# Patient Record
Sex: Female | Born: 1955
Health system: Southern US, Community
[De-identification: ages and names within clinical notes are randomized; demographics above are authoritative.]

## PROBLEM LIST (undated history)

## (undated) DIAGNOSIS — K219 Gastro-esophageal reflux disease without esophagitis: Secondary | ICD-10-CM

## (undated) DIAGNOSIS — E119 Type 2 diabetes mellitus without complications: Secondary | ICD-10-CM

## (undated) DIAGNOSIS — F419 Anxiety disorder, unspecified: Secondary | ICD-10-CM

## (undated) DIAGNOSIS — I10 Essential (primary) hypertension: Secondary | ICD-10-CM

## (undated) DIAGNOSIS — G4730 Sleep apnea, unspecified: Secondary | ICD-10-CM

## (undated) DIAGNOSIS — C569 Malignant neoplasm of unspecified ovary: Secondary | ICD-10-CM

## (undated) DIAGNOSIS — M1990 Unspecified osteoarthritis, unspecified site: Secondary | ICD-10-CM

## (undated) DIAGNOSIS — Z8489 Family history of other specified conditions: Secondary | ICD-10-CM

## (undated) HISTORY — DX: Anxiety disorder, unspecified: F41.9

## (undated) HISTORY — DX: Type 2 diabetes mellitus without complications: E11.9

## (undated) HISTORY — DX: Essential (primary) hypertension: I10

## (undated) HISTORY — PX: NOSE SURGERY: SHX723

## (undated) HISTORY — DX: Malignant neoplasm of unspecified ovary: C56.9

---

## 2004-06-14 DIAGNOSIS — C569 Malignant neoplasm of unspecified ovary: Secondary | ICD-10-CM

## 2004-06-14 HISTORY — PX: ABDOMINAL HYSTERECTOMY: SHX81

## 2004-06-14 HISTORY — DX: Malignant neoplasm of unspecified ovary: C56.9

## 2005-12-24 ENCOUNTER — Ambulatory Visit: Payer: Self-pay | Admitting: Family Medicine

## 2006-01-17 ENCOUNTER — Inpatient Hospital Stay: Payer: Self-pay | Admitting: Obstetrics and Gynecology

## 2006-01-28 ENCOUNTER — Ambulatory Visit: Payer: Self-pay | Admitting: Obstetrics and Gynecology

## 2006-02-23 ENCOUNTER — Ambulatory Visit: Payer: Self-pay | Admitting: Gynecologic Oncology

## 2006-03-14 ENCOUNTER — Ambulatory Visit: Payer: Self-pay | Admitting: Gynecologic Oncology

## 2006-04-14 ENCOUNTER — Ambulatory Visit: Payer: Self-pay | Admitting: Gynecologic Oncology

## 2006-05-14 ENCOUNTER — Ambulatory Visit: Payer: Self-pay | Admitting: Gynecologic Oncology

## 2006-05-31 ENCOUNTER — Ambulatory Visit: Payer: Self-pay | Admitting: Family Medicine

## 2006-06-14 ENCOUNTER — Ambulatory Visit: Payer: Self-pay | Admitting: Gynecologic Oncology

## 2006-07-15 ENCOUNTER — Ambulatory Visit: Payer: Self-pay | Admitting: Gynecologic Oncology

## 2007-02-03 ENCOUNTER — Ambulatory Visit: Payer: Self-pay | Admitting: Internal Medicine

## 2007-02-13 ENCOUNTER — Ambulatory Visit: Payer: Self-pay | Admitting: Internal Medicine

## 2007-06-15 ENCOUNTER — Ambulatory Visit: Payer: Self-pay | Admitting: Gynecologic Oncology

## 2007-06-20 ENCOUNTER — Ambulatory Visit: Payer: Self-pay | Admitting: Gastroenterology

## 2007-07-31 ENCOUNTER — Ambulatory Visit: Payer: Self-pay | Admitting: Family Medicine

## 2007-10-13 ENCOUNTER — Ambulatory Visit: Payer: Self-pay | Admitting: Gynecologic Oncology

## 2007-11-13 ENCOUNTER — Ambulatory Visit: Payer: Self-pay | Admitting: Gynecologic Oncology

## 2008-03-14 ENCOUNTER — Ambulatory Visit: Payer: Self-pay | Admitting: Gynecologic Oncology

## 2008-03-20 IMAGING — CT CT CHEST-ABD-PELV W/ CM
1 of 3 series · 13 of 31 positions shown, 19 images · non-contrast
Comparison: none

REASON FOR EXAM: Abdominal mass
COMMENTS:

[Series 2: soft tissue · axial · 0.74mm/px · z∈[-624,-98]mm · 13 of 125 slices shown, 19 images]
[im 10/125  mediastinal]
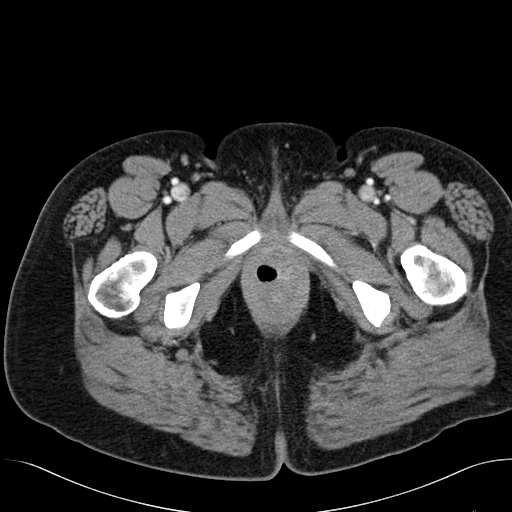
[im 10/125  bone]
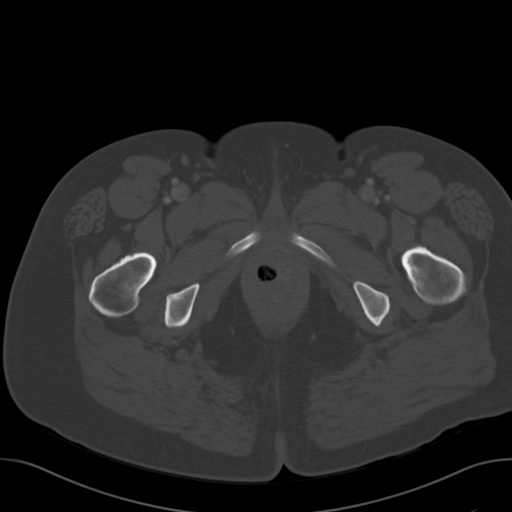
[im 20/125  mediastinal]
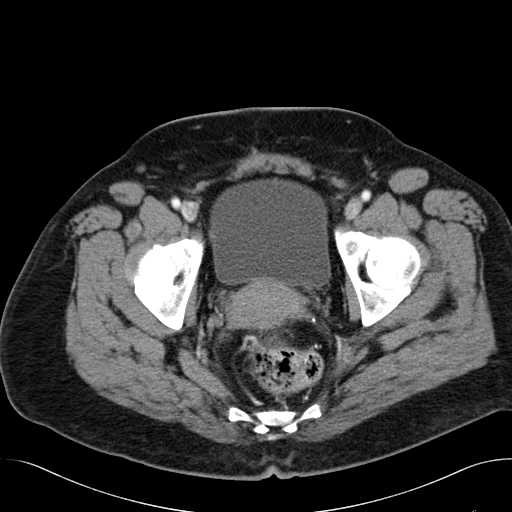
[im 29/125  mediastinal]
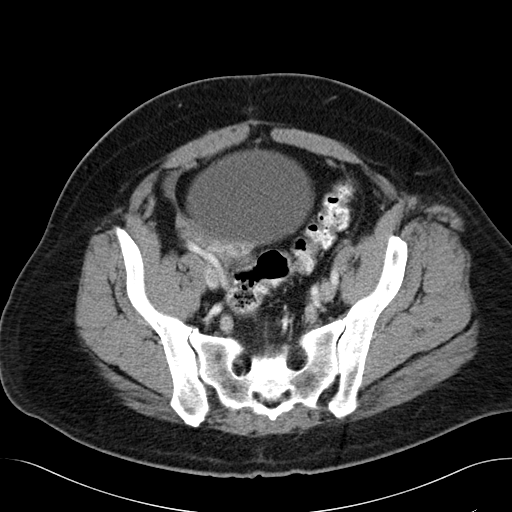
[im 39/125  mediastinal]
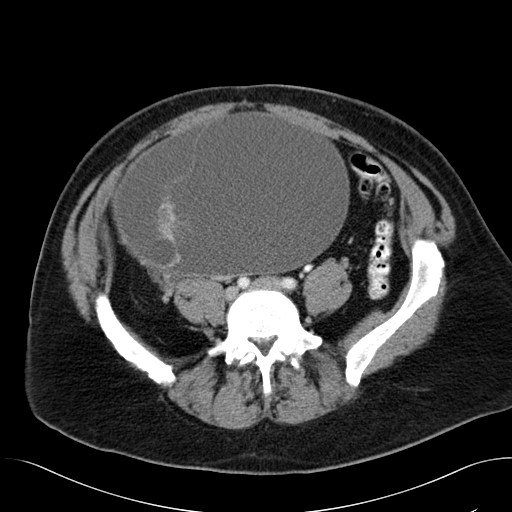
[im 48/125  mediastinal]
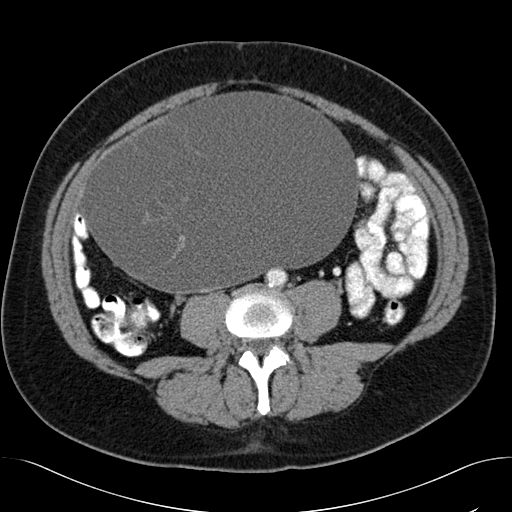
[im 58/125  mediastinal]
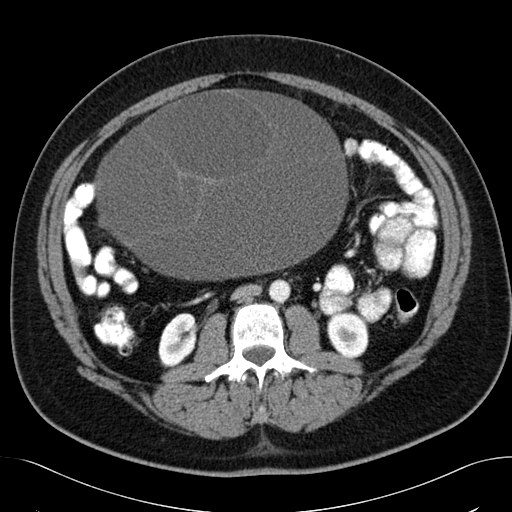
[im 63/125  mediastinal]
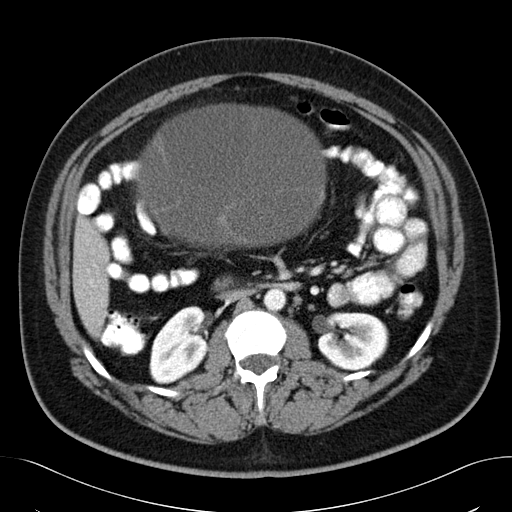
[im 67/125  mediastinal]
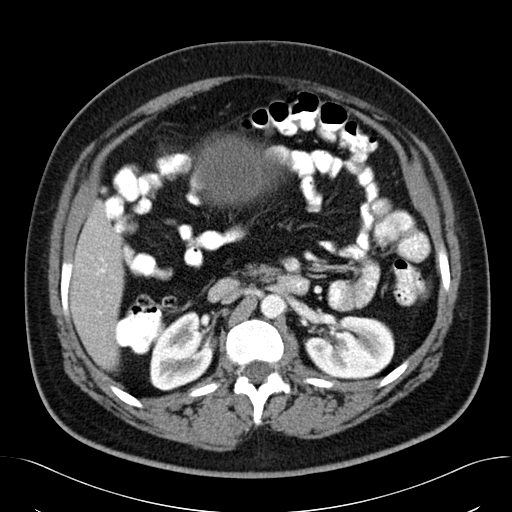
[im 77/125  mediastinal]
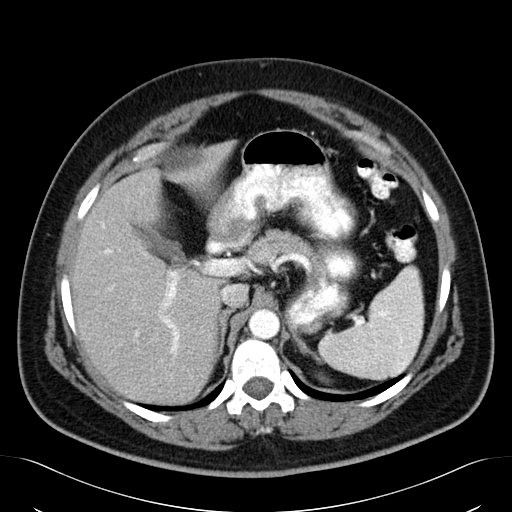
[im 77/125  bone]
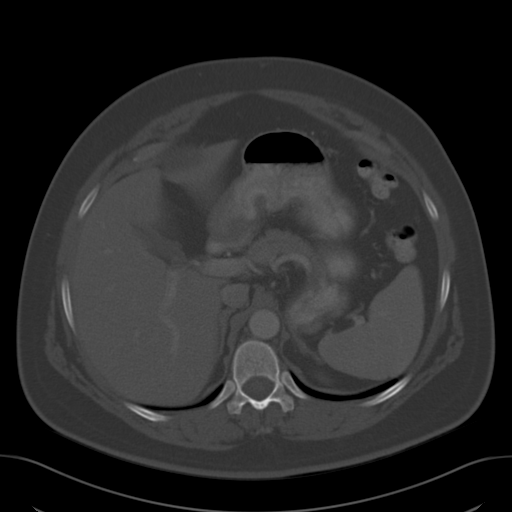
[im 86/125  mediastinal]
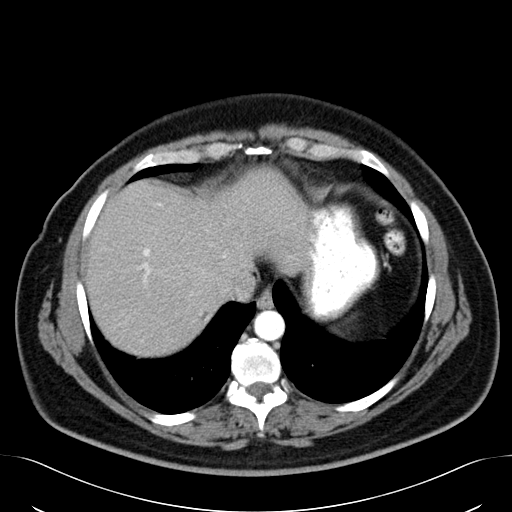
[im 86/125  lung]
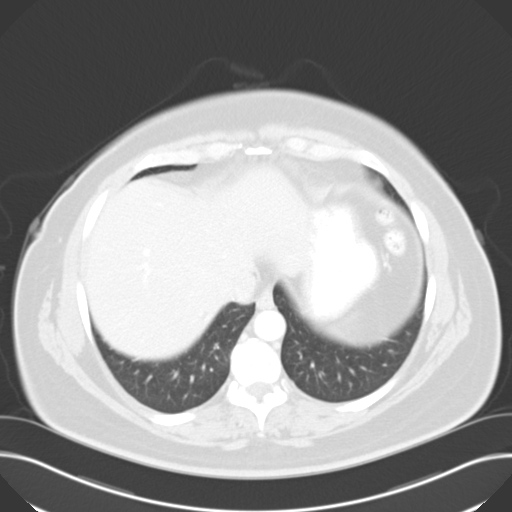
[im 96/125  mediastinal]
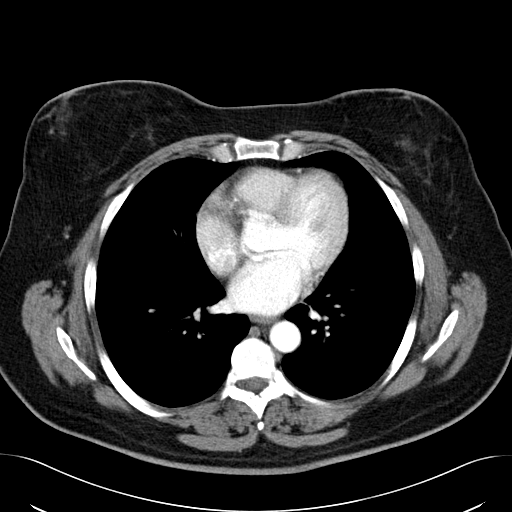
[im 96/125  lung]
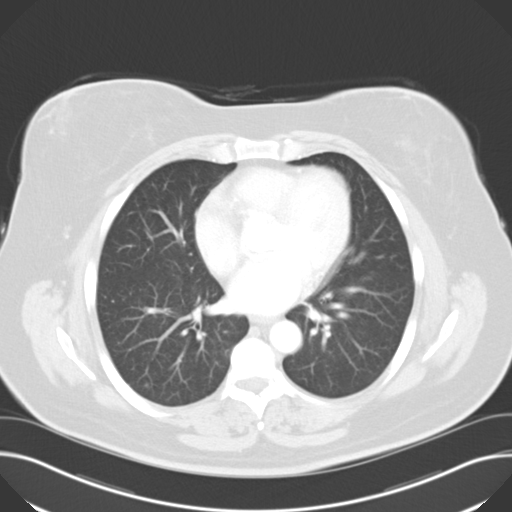
[im 105/125  mediastinal]
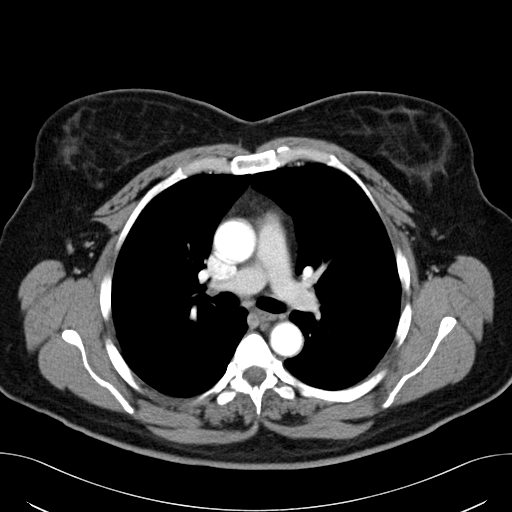
[im 105/125  lung]
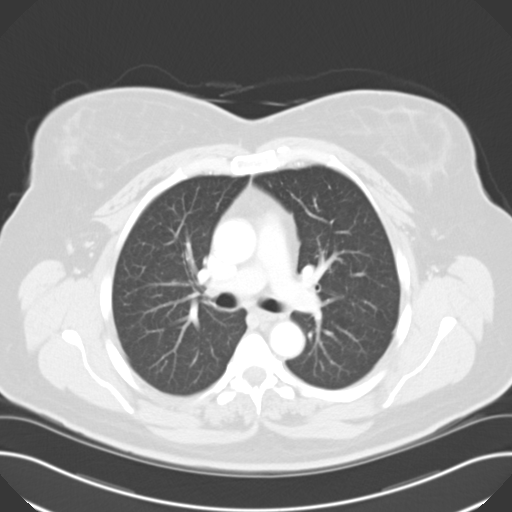
[im 115/125  mediastinal]
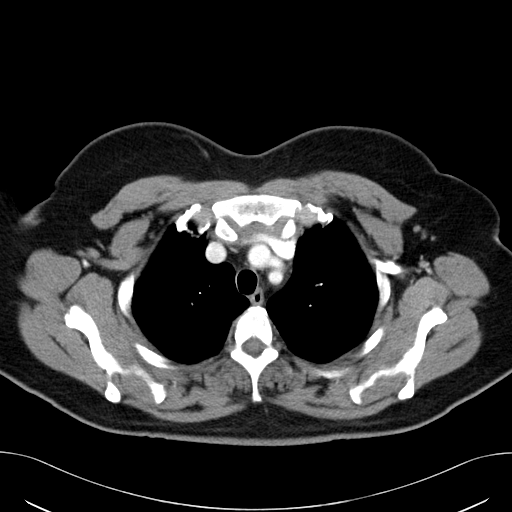
[im 115/125  lung]
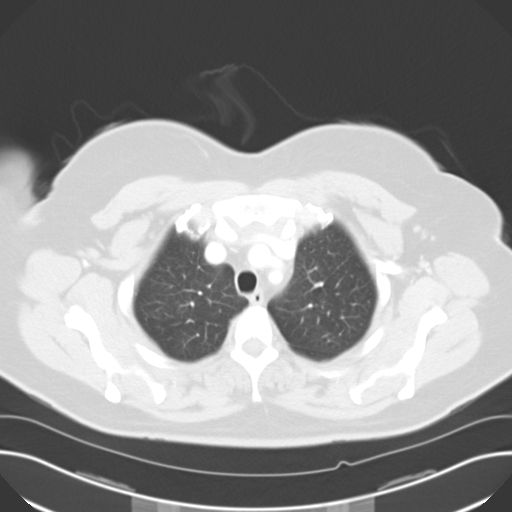

[13 of 31 positions shown; findings below may reference images not displayed]

PROCEDURE:     CT  - CT CHEST ABDOMEN AND PELVIS W  - December 24, 2005  [DATE]

RESULT:

REASON FOR CONSULTATION:  Abdominal distention.

CHEST CT:  On mediastinal window settings no mediastinal adenopathy is
noted.  No hilar adenopathy or subcarinal adenopathy is seen.  On the lung
window settings there is a single small noncalcified nodule in the RIGHT
lower lobe measuring approximately 4 mm.  This could represent a
noncalcified granuloma.  The possibility of single metastasis would be of
consideration.  I would recommend a six-month followup to see if this is
stable.  No effusions are noted.

ABDOMINAL AND PELVIC CT: In addition to the axial images, coronal and
sagittal images are obtained of the abdomen.

There is noted a large primarily cystic structure extending from the pelvis
superiorly just below the liver in mid to RIGHT abdomen. It measures 20.5 cm
in height.  There is noted a transverse dimension of 21 cm and in AP 14 cm.
There are noted septa within this cystic structure.  No ascites is present.
No retroperitoneal adenopathy.  No pelvic adenopathy.  A 2 cm LEFT ovarian
cyst is noted.  Most likely the findings are compatible with mucinous or
serous cystadenoma.  Carcinoma would seem less likely with no evidence of
metastasis.  There is seen a 6 x 3.6 cm cyst in the LEFT lobe of the liver.
The pancreas appears intact.  Both kidneys excrete the contrast material.
IMPRESSION: No mediastinal masses.

A single noncalcified tiny nodule possibly 4 mm in the RIGHT lower lobe
which may represent benign lesion versus metastatic lesion.

Large pelvic abdominal mass containing septa most likely representing a
mucinous or serous cystadenoma but less likely carcinoma.

No ascites and no retroperitoneal or pelvic adenopathy.  No liver lesions.
There is noted a cyst in the LEFT lobe of the liver.

## 2008-04-30 ENCOUNTER — Emergency Department: Payer: Self-pay | Admitting: Emergency Medicine

## 2008-12-17 ENCOUNTER — Ambulatory Visit: Payer: Self-pay | Admitting: Gynecologic Oncology

## 2009-01-12 ENCOUNTER — Ambulatory Visit: Payer: Self-pay | Admitting: Gynecologic Oncology

## 2009-08-12 ENCOUNTER — Ambulatory Visit: Payer: Self-pay | Admitting: Gynecologic Oncology

## 2009-09-08 ENCOUNTER — Ambulatory Visit: Payer: Self-pay | Admitting: Gynecologic Oncology

## 2009-09-12 ENCOUNTER — Ambulatory Visit: Payer: Self-pay | Admitting: Gynecologic Oncology

## 2010-02-12 ENCOUNTER — Ambulatory Visit: Payer: Self-pay | Admitting: Gynecologic Oncology

## 2010-03-10 ENCOUNTER — Ambulatory Visit: Payer: Self-pay | Admitting: Gynecologic Oncology

## 2010-03-14 ENCOUNTER — Ambulatory Visit: Payer: Self-pay | Admitting: Gynecologic Oncology

## 2011-02-09 ENCOUNTER — Ambulatory Visit: Payer: Self-pay | Admitting: Unknown Physician Specialty

## 2014-07-01 ENCOUNTER — Emergency Department: Payer: Self-pay | Admitting: Emergency Medicine

## 2017-07-27 ENCOUNTER — Encounter: Payer: Self-pay | Admitting: General Surgery

## 2017-08-10 ENCOUNTER — Encounter: Payer: Self-pay | Admitting: *Deleted

## 2017-08-16 ENCOUNTER — Ambulatory Visit: Payer: BLUE CROSS/BLUE SHIELD | Admitting: General Surgery

## 2017-08-16 ENCOUNTER — Encounter: Payer: Self-pay | Admitting: General Surgery

## 2017-08-16 VITALS — BP 128/70 | HR 82 | Resp 12 | Ht 63.0 in | Wt 204.0 lb

## 2017-08-16 DIAGNOSIS — D171 Benign lipomatous neoplasm of skin and subcutaneous tissue of trunk: Secondary | ICD-10-CM | POA: Diagnosis not present

## 2017-08-16 NOTE — Patient Instructions (Addendum)
Schedule excision of lipoma with Same Day Surgery  Lipoma A lipoma is a noncancerous (benign) tumor that is made up of fat cells. This is a very common type of soft-tissue growth. Lipomas are usually found under the skin (subcutaneous). They may occur in any tissue of the body that contains fat. Common areas for lipomas to appear include the back, shoulders, buttocks, and thighs. Lipomas grow slowly, and they are usually painless. Most lipomas do not cause problems and do not require treatment. What are the causes? The cause of this condition is not known. What increases the risk? This condition is more likely to develop in:  People who are 67-55 years old.  People who have a family history of lipomas.  What are the signs or symptoms? A lipoma usually appears as a small, round bump under the skin. It may feel soft or rubbery, but the firmness can vary. Most lipomas are not painful. However, a lipoma may become painful if it is located in an area where it pushes on nerves. How is this diagnosed? A lipoma can usually be diagnosed with a physical exam. You may also have tests to confirm the diagnosis and to rule out other conditions. Tests may include:  Imaging tests, such as a CT scan or MRI.  Removal of a tissue sample to be looked at under a microscope (biopsy).  How is this treated? Treatment is not needed for small lipomas that are not causing problems. If a lipoma continues to get bigger or it causes problems, removal is often the best option. Lipomas can also be removed to improve appearance. Removal of a lipoma is usually done with a surgery in which the fatty cells and the surrounding capsule are removed. Most often, a medicine that numbs the area (local anesthetic) is used for this procedure. Follow these instructions at home:  Keep all follow-up visits as directed by your health care provider. This is important. Contact a health care provider if:  Your lipoma becomes larger or  hard.  Your lipoma becomes painful, red, or increasingly swollen. These could be signs of infection or a more serious condition. This information is not intended to replace advice given to you by your health care provider. Make sure you discuss any questions you have with your health care provider. Document Released: 05/21/2002 Document Revised: 11/06/2015 Document Reviewed: 05/27/2014 Elsevier Interactive Patient Education  [employer].  The patient is scheduled for surgery at [facility] on 08/26/17. She will pre admit by phone. The patient is aware of date and instructions.

## 2017-08-16 NOTE — Progress Notes (Signed)
Patient ID: Natasha Jimenez, female   DOB: 1956/02/10, 62 y.o.   MRN: [id number]  Chief Complaint  Patient presents with  . Lipoma    HPI Natasha Jimenez is a 62 y.o. female.  Here for evaluation of a lipoma referred by Dr Ouida Sills. She states she has noticed a knot right back for at least 10 years. She states it is getting larger over the past year and some pain for the past 6 months. Her husband states it started out about the size of an egg. She is here with her husband, Natasha Jimenez. 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  HPI  Past Medical History:  Diagnosis Date  . Anxiety   . Diabetes mellitus without complication ([place])   . Hypertension   . Ovarian cancer ([place]) 2006    Past Surgical History:  Procedure Laterality Date  . ABDOMINAL HYSTERECTOMY  2006  . NOSE SURGERY      Family History  Problem Relation Age of Onset  . Stomach cancer Father   . Colon cancer Neg Hx   . Breast cancer Neg Hx     Social History Social History   Tobacco Use  . Smoking status: Former Smoker    Years: 2.00    Last attempt to quit: 06/14/2014    Years since quitting: 3.1  . Smokeless tobacco: Never Used  Substance Use Topics  . Alcohol use: No    Frequency: Never  . Drug use: No    No Known Allergies  Current Outpatient Medications  Medication Sig Dispense Refill  . estradiol (ESTRACE) 1 MG tablet Take 1 mg by mouth daily.     Marland Kitchen FLUoxetine (PROZAC) 20 MG capsule Take 20 mg by mouth daily.     Marland Kitchen lisinopril-hydrochlorothiazide (PRINZIDE,ZESTORETIC) 20-25 MG tablet Take 1 tablet by mouth daily.     . metFORMIN (GLUCOPHAGE-XR) 500 MG 24 hr tablet Take 500 mg by mouth daily with breakfast.     . rosuvastatin (CRESTOR) 20 MG tablet Take 20 mg by mouth daily.     . THYROID PO Take by mouth daily.     No current facility-administered medications for this visit.     Review of Systems Review of Systems  Constitutional: Negative.   Respiratory: Negative.    Cardiovascular: Negative.     Blood pressure 128/70, pulse 82, resp. rate 12, height 5\' 3"  (1.6 m), weight 204 lb (92.5 kg).  Physical Exam Physical Exam  Constitutional: She is oriented to person, place, and time. She appears well-developed and well-nourished.  HENT:  Mouth/Throat: Oropharynx is clear and moist.  Eyes: Conjunctivae are normal. No scleral icterus.  Neck: Neck supple.  Cardiovascular: Normal rate, regular rhythm and normal heart sounds.  Pulmonary/Chest: Effort normal and breath sounds normal.      Lymphadenopathy:    She has no cervical adenopathy.  Neurological: She is alert and oriented to person, place, and time.  Skin: Skin is warm and dry.  9 x 15 cm lipoma posterior right lateral back  Psychiatric: Her behavior is normal.    Data Reviewed Basic metabolic panel dated June 21, 2016 showed a normal electrolyte profile, creatinine of 0.9, estimated GFR 63, blood sugar 154.   Assessment    Large right posterior lateral flank mass consistent with a lipoma.    Plan    Schedule excision of lipoma with Same Day Surgery based on its size.     HPI, Physical Exam, Assessment and Plan have  been scribed under the direction and in the presence of Robert Bellow, MD. Karie Fetch, RN  I have completed the exam and reviewed the above documentation for accuracy and completeness.  I agree with the above.  Haematologist has been used and any errors in dictation or transcription are unintentional.  Hervey Ard, M.D., F.A.C.S.  The patient is scheduled for surgery at [facility] on 08/26/17. She will pre admit by phone. The patient is aware of date and instructions.  Documented by Caryl-Lyn Otis Brace LPN  Forest Gleason Phelan Schadt 08/16/2017, 8:07 PM

## 2017-08-19 ENCOUNTER — Encounter
Admission: RE | Admit: 2017-08-19 | Discharge: 2017-08-19 | Disposition: A | Discharge status: Home / Self Care | Payer: BLUE CROSS/BLUE SHIELD | Source: Ambulatory Visit | Attending: General Surgery | Admitting: General Surgery

## 2017-08-19 ENCOUNTER — Encounter: Payer: Self-pay | Admitting: *Deleted

## 2017-08-19 ENCOUNTER — Other Ambulatory Visit: Payer: Self-pay

## 2017-08-19 HISTORY — DX: Sleep apnea, unspecified: G47.30

## 2017-08-19 HISTORY — DX: Unspecified osteoarthritis, unspecified site: M19.90

## 2017-08-19 HISTORY — DX: Family history of other specified conditions: Z84.89

## 2017-08-19 HISTORY — DX: Gastro-esophageal reflux disease without esophagitis: K21.9

## 2017-08-19 NOTE — Patient Instructions (Addendum)
Your procedure is scheduled on: 08-26-17 FRIDAY  Report to Same Day Surgery 2nd floor medical mall [facility] Entrance-take elevator on left to 2nd floor.  Check in with surgery information desk.) To find out your arrival time please call [phone] between 1PM - 3PM on 08-25-17 THURSDAY  Remember: Instructions that are not followed completely may result in serious medical risk, up to and including death, or upon the discretion of your surgeon and anesthesiologist your surgery may need to be rescheduled.    _x___ 1. Do not eat food after midnight the night before your procedure. NO GUM OR CANDY AFTER MIDNIGHT.  You may drink WATER up to 2 hours before you are scheduled to arrive at the hospital for your procedure.  Do not drink WATER within 2 hours of your scheduled arrival to the hospital.  Type 1 and type 2 diabetics should only drink water.     __x__ 2. No Alcohol for 24 hours before or after surgery.   __x__3. No Smoking or e-cigarettes for 24 prior to surgery.  Do not use any chewable tobacco products for at least 6 hour prior to surgery   ____  4. Bring all medications with you on the day of surgery if instructed.    __x__ 5. Notify your doctor if there is any change in your medical condition     (cold, fever, infections).    x___6. On the morning of surgery brush your teeth with toothpaste and water.  You may rinse your mouth with mouth wash if you wish.  Do not swallow any toothpaste or mouthwash.   Do not wear jewelry, make-up, hairpins, clips or nail polish.  Do not wear lotions, powders, or perfumes. You may wear deodorant.  Do not shave 48 hours prior to surgery. Men may shave face and neck.  Do not bring valuables to the hospital.    [facility] is not responsible for any belongings or valuables.               Contacts, dentures or bridgework may not be worn into surgery.  Leave your suitcase in the car. After surgery it may be brought to your room.  For patients  admitted to the hospital, discharge time is determined by your treatment team.  _  Patients discharged the day of surgery will not be allowed to drive home.  You will need someone to drive you home and stay with you the night of your procedure.    Please read over the following fact sheets that you were given:   [facility] Preparing for Surgery and or MRSA Information   _x___ TAKE THE FOLLOWING MEDICATION THE MORNING OF SURGERY WITH A SMALL SIP OF WATER. These include:  1. PROZAC  2.  3.  4.  5.  6.  ____Fleets enema or Magnesium Citrate as directed.   _x___ Use CHG Soap or sage wipes as directed on instruction sheet   ____ Use inhalers on the day of surgery and bring to hospital day of surgery  _X___ Stop Metformin 2 days prior to surgery-LAST DOSE ON Tuesday, MARCH 12TH   ____ Take 1/2 of usual insulin dose the night before surgery and none on the morning surgery.   ____ Follow recommendations from Cardiologist, Pulmonologist or PCP regarding stopping Aspirin, Coumadin, Plavix ,Eliquis, Effient, or Pradaxa, and Pletal.  ____Stop Anti-inflammatories such as Advil, Aleve, Ibuprofen, Motrin, Naproxen, Naprosyn, Goodies powders or aspirin products. OK to take Tylenol    _X___ Stop supplements  until after surgery-STOP THYROID SUPPORT NOW-YOU MAY RESUME AFTER SURGERY   ____ Bring C-Pap to the hospital.

## 2017-08-22 ENCOUNTER — Encounter
Admission: RE | Admit: 2017-08-22 | Discharge: 2017-08-22 | Disposition: A | Discharge status: Home / Self Care | Payer: BLUE CROSS/BLUE SHIELD | Source: Ambulatory Visit | Attending: General Surgery | Admitting: General Surgery

## 2017-08-22 DIAGNOSIS — D171 Benign lipomatous neoplasm of skin and subcutaneous tissue of trunk: Secondary | ICD-10-CM | POA: Insufficient documentation

## 2017-08-22 DIAGNOSIS — I10 Essential (primary) hypertension: Secondary | ICD-10-CM | POA: Diagnosis not present

## 2017-08-22 LAB — BASIC METABOLIC PANEL
Anion gap: 10 (ref 5–15)
BUN: 15 mg/dL (ref 6–20)
CALCIUM: 9 mg/dL (ref 8.9–10.3)
CHLORIDE: 99 mmol/L — AB (ref 101–111)
CO2: 26 mmol/L (ref 22–32)
CREATININE: 0.83 mg/dL (ref 0.44–1.00)
GFR calc Af Amer: >60 mL/min (ref >60)
GFR calc non Af Amer: >60 mL/min (ref >60)
Glucose, Bld: 197 mg/dL — ABNORMAL HIGH (ref 65–99)
Potassium: 3.7 mmol/L (ref 3.5–5.1)
Sodium: 135 mmol/L (ref 135–145)

## 2017-08-26 ENCOUNTER — Encounter: Payer: Self-pay | Admitting: *Deleted

## 2017-08-26 ENCOUNTER — Ambulatory Visit
Admission: RE | Admit: 2017-08-26 | Discharge: 2017-08-26 | Disposition: A | Discharge status: Home / Self Care | Payer: BLUE CROSS/BLUE SHIELD | Source: Ambulatory Visit | Attending: General Surgery | Admitting: General Surgery

## 2017-08-26 ENCOUNTER — Ambulatory Visit: Payer: BLUE CROSS/BLUE SHIELD | Admitting: Certified Registered Nurse Anesthetist

## 2017-08-26 ENCOUNTER — Encounter
Admission: RE | Disposition: A | Discharge status: Home / Self Care | Payer: Self-pay | Source: Ambulatory Visit | Attending: General Surgery

## 2017-08-26 ENCOUNTER — Other Ambulatory Visit: Payer: Self-pay

## 2017-08-26 DIAGNOSIS — M1990 Unspecified osteoarthritis, unspecified site: Secondary | ICD-10-CM | POA: Insufficient documentation

## 2017-08-26 DIAGNOSIS — E785 Hyperlipidemia, unspecified: Secondary | ICD-10-CM | POA: Diagnosis not present

## 2017-08-26 DIAGNOSIS — Z7984 Long term (current) use of oral hypoglycemic drugs: Secondary | ICD-10-CM | POA: Insufficient documentation

## 2017-08-26 DIAGNOSIS — Z79890 Hormone replacement therapy (postmenopausal): Secondary | ICD-10-CM | POA: Diagnosis not present

## 2017-08-26 DIAGNOSIS — Z79899 Other long term (current) drug therapy: Secondary | ICD-10-CM | POA: Diagnosis not present

## 2017-08-26 DIAGNOSIS — Z8543 Personal history of malignant neoplasm of ovary: Secondary | ICD-10-CM | POA: Insufficient documentation

## 2017-08-26 DIAGNOSIS — F419 Anxiety disorder, unspecified: Secondary | ICD-10-CM | POA: Insufficient documentation

## 2017-08-26 DIAGNOSIS — Z87891 Personal history of nicotine dependence: Secondary | ICD-10-CM | POA: Insufficient documentation

## 2017-08-26 DIAGNOSIS — I10 Essential (primary) hypertension: Secondary | ICD-10-CM | POA: Diagnosis not present

## 2017-08-26 DIAGNOSIS — D171 Benign lipomatous neoplasm of skin and subcutaneous tissue of trunk: Secondary | ICD-10-CM | POA: Insufficient documentation

## 2017-08-26 DIAGNOSIS — E669 Obesity, unspecified: Secondary | ICD-10-CM | POA: Diagnosis not present

## 2017-08-26 DIAGNOSIS — D216 Benign neoplasm of connective and other soft tissue of trunk, unspecified: Secondary | ICD-10-CM | POA: Diagnosis not present

## 2017-08-26 DIAGNOSIS — E119 Type 2 diabetes mellitus without complications: Secondary | ICD-10-CM | POA: Insufficient documentation

## 2017-08-26 HISTORY — PX: LIPOMA EXCISION: SHX5283

## 2017-08-26 LAB — GLUCOSE, CAPILLARY
GLUCOSE-CAPILLARY: 188 mg/dL — AB (ref 65–99)
Glucose-Capillary: 165 mg/dL — ABNORMAL HIGH (ref 65–99)

## 2017-08-26 SURGERY — EXCISION LIPOMA
Anesthesia: General | Laterality: Right | Wound class: Clean

## 2017-08-26 MED ORDER — PROPOFOL 10 MG/ML IV BOLUS
INTRAVENOUS | Status: AC
  Filled 2017-08-26: qty 20

## 2017-08-26 MED ORDER — BUPIVACAINE HCL 0.5 % IJ SOLN
INTRAMUSCULAR | Status: DC | PRN
  Administered 2017-08-26: 30 mL

## 2017-08-26 MED ORDER — HYDROCODONE-ACETAMINOPHEN 5-325 MG PO TABS
ORAL_TABLET | ORAL | Status: AC
  Administered 2017-08-26: 1
  Filled 2017-08-26: qty 1

## 2017-08-26 MED ORDER — ACETAMINOPHEN 10 MG/ML IV SOLN
INTRAVENOUS | Status: DC | PRN
  Administered 2017-08-26: 1000 mg via INTRAVENOUS

## 2017-08-26 MED ORDER — FENTANYL CITRATE (PF) 100 MCG/2ML IJ SOLN
INTRAMUSCULAR | Status: AC
  Filled 2017-08-26: qty 2

## 2017-08-26 MED ORDER — ROCURONIUM BROMIDE 100 MG/10ML IV SOLN
INTRAVENOUS | Status: DC | PRN
  Administered 2017-08-26: 5 mg via INTRAVENOUS

## 2017-08-26 MED ORDER — LIDOCAINE HCL (CARDIAC) 20 MG/ML IV SOLN
INTRAVENOUS | Status: DC | PRN
  Administered 2017-08-26: 80 mg via INTRAVENOUS

## 2017-08-26 MED ORDER — SODIUM CHLORIDE 0.9 % IV SOLN
INTRAVENOUS | Status: DC
  Administered 2017-08-26: 07:00:00 via INTRAVENOUS

## 2017-08-26 MED ORDER — LIDOCAINE HCL (PF) 2 % IJ SOLN
INTRAMUSCULAR | Status: AC
  Filled 2017-08-26: qty 10

## 2017-08-26 MED ORDER — ONDANSETRON HCL 4 MG/2ML IJ SOLN
INTRAMUSCULAR | Status: DC | PRN
  Administered 2017-08-26: 4 mg via INTRAVENOUS

## 2017-08-26 MED ORDER — EPHEDRINE SULFATE 50 MG/ML IJ SOLN
INTRAMUSCULAR | Status: AC
  Filled 2017-08-26: qty 1

## 2017-08-26 MED ORDER — EPHEDRINE SULFATE 50 MG/ML IJ SOLN
INTRAMUSCULAR | Status: DC | PRN
  Administered 2017-08-26 (×3): 10 mg via INTRAVENOUS

## 2017-08-26 MED ORDER — OXYCODONE HCL 5 MG PO TABS: 5.0000 mg | ORAL_TABLET | Freq: Once | ORAL | Status: DC | PRN

## 2017-08-26 MED ORDER — MIDAZOLAM HCL 2 MG/2ML IJ SOLN
INTRAMUSCULAR | Status: AC
  Filled 2017-08-26: qty 2

## 2017-08-26 MED ORDER — PROMETHAZINE HCL 25 MG/ML IJ SOLN: 6.2500 mg | INTRAMUSCULAR | Status: DC | PRN

## 2017-08-26 MED ORDER — HYDROCODONE-ACETAMINOPHEN 5-325 MG PO TABS
1.0000 | ORAL_TABLET | ORAL | 0 refills | Status: AC | PRN
Start: 2017-08-26 — End: 2018-08-26

## 2017-08-26 MED ORDER — KETOROLAC TROMETHAMINE 30 MG/ML IJ SOLN
INTRAMUSCULAR | Status: AC
  Filled 2017-08-26: qty 1

## 2017-08-26 MED ORDER — FENTANYL CITRATE (PF) 100 MCG/2ML IJ SOLN
25.0000 ug | INTRAMUSCULAR | Status: DC | PRN
  Administered 2017-08-26: 50 ug via INTRAVENOUS

## 2017-08-26 MED ORDER — OXYCODONE HCL 5 MG/5ML PO SOLN: 5.0000 mg | Freq: Once | ORAL | Status: DC | PRN

## 2017-08-26 MED ORDER — MEPERIDINE HCL 50 MG/ML IJ SOLN: 6.2500 mg | INTRAMUSCULAR | Status: DC | PRN

## 2017-08-26 MED ORDER — FAMOTIDINE 20 MG PO TABS
ORAL_TABLET | ORAL | Status: AC
  Administered 2017-08-26: 20 mg via ORAL
  Filled 2017-08-26: qty 1

## 2017-08-26 MED ORDER — DEXAMETHASONE SODIUM PHOSPHATE 10 MG/ML IJ SOLN
INTRAMUSCULAR | Status: DC | PRN
  Administered 2017-08-26: 10 mg via INTRAVENOUS

## 2017-08-26 MED ORDER — DEXAMETHASONE SODIUM PHOSPHATE 10 MG/ML IJ SOLN
INTRAMUSCULAR | Status: AC
  Filled 2017-08-26: qty 1

## 2017-08-26 MED ORDER — FENTANYL CITRATE (PF) 100 MCG/2ML IJ SOLN
INTRAMUSCULAR | Status: AC
  Administered 2017-08-26: 50 ug via INTRAVENOUS
  Filled 2017-08-26: qty 2

## 2017-08-26 MED ORDER — FAMOTIDINE 20 MG PO TABS
20.0000 mg | ORAL_TABLET | Freq: Once | ORAL | Status: AC
  Administered 2017-08-26: 20 mg via ORAL

## 2017-08-26 MED ORDER — ACETAMINOPHEN 10 MG/ML IV SOLN
INTRAVENOUS | Status: AC
  Filled 2017-08-26: qty 100

## 2017-08-26 MED ORDER — PROPOFOL 10 MG/ML IV BOLUS
INTRAVENOUS | Status: DC | PRN
  Administered 2017-08-26: 200 mg via INTRAVENOUS

## 2017-08-26 MED ORDER — FENTANYL CITRATE (PF) 100 MCG/2ML IJ SOLN
INTRAMUSCULAR | Status: DC | PRN
  Administered 2017-08-26 (×2): 50 ug via INTRAVENOUS

## 2017-08-26 MED ORDER — MIDAZOLAM HCL 2 MG/2ML IJ SOLN
INTRAMUSCULAR | Status: DC | PRN
  Administered 2017-08-26: 2 mg via INTRAVENOUS

## 2017-08-26 MED ORDER — SUCCINYLCHOLINE CHLORIDE 20 MG/ML IJ SOLN
INTRAMUSCULAR | Status: DC | PRN
  Administered 2017-08-26: 140 mg via INTRAVENOUS

## 2017-08-26 MED ORDER — SODIUM CHLORIDE 0.9 % IJ SOLN
INTRAMUSCULAR | Status: AC
Start: 2017-08-26 — End: ?
  Filled 2017-08-26: qty 10

## 2017-08-26 MED ORDER — BUPIVACAINE HCL (PF) 0.5 % IJ SOLN
INTRAMUSCULAR | Status: AC
  Filled 2017-08-26: qty 30

## 2017-08-26 MED ORDER — LACTATED RINGERS IV SOLN
INTRAVENOUS | Status: DC | PRN
  Administered 2017-08-26 (×2): via INTRAVENOUS

## 2017-08-26 MED ORDER — KETOROLAC TROMETHAMINE 30 MG/ML IJ SOLN
INTRAMUSCULAR | Status: DC | PRN
  Administered 2017-08-26: 30 mg via INTRAVENOUS

## 2017-08-26 MED ORDER — ONDANSETRON HCL 4 MG/2ML IJ SOLN
INTRAMUSCULAR | Status: AC
  Filled 2017-08-26: qty 2

## 2017-08-26 MED ORDER — SUCCINYLCHOLINE CHLORIDE 20 MG/ML IJ SOLN
INTRAMUSCULAR | Status: AC
  Filled 2017-08-26: qty 1

## 2017-08-26 SURGICAL SUPPLY — 39 items
BANDAGE ELASTIC 4 LF NS (GAUZE/BANDAGES/DRESSINGS) ×3 IMPLANT
BANDAGE ELASTIC 6 LF NS (GAUZE/BANDAGES/DRESSINGS) ×3 IMPLANT
BLADE SURG 15 STRL SS SAFETY (BLADE) ×3 IMPLANT
BNDG GAUZE 4.5X4.1 6PLY STRL (MISCELLANEOUS) ×3 IMPLANT
CANISTER SUCT 1200ML W/VALVE (MISCELLANEOUS) ×3 IMPLANT
CHLORAPREP W/TINT 26ML (MISCELLANEOUS) ×3 IMPLANT
CLOSURE WOUND 1/2 X4 (GAUZE/BANDAGES/DRESSINGS) ×1
DRAPE LAPAROTOMY 100X77 ABD (DRAPES) ×3 IMPLANT
DRAPE SHEET LG 3/4 BI-LAMINATE (DRAPES) ×3 IMPLANT
DRSG OPSITE POSTOP 4X6 (GAUZE/BANDAGES/DRESSINGS) ×3 IMPLANT
DRSG TEGADERM 4X4.75 (GAUZE/BANDAGES/DRESSINGS) IMPLANT
DRSG TELFA 4X3 1S NADH ST (GAUZE/BANDAGES/DRESSINGS) ×3 IMPLANT
ELECT REM PT RETURN 9FT ADLT (ELECTROSURGICAL) ×3
ELECTRODE REM PT RTRN 9FT ADLT (ELECTROSURGICAL) ×1 IMPLANT
GAUZE SPONGE 4X4 12PLY STRL (GAUZE/BANDAGES/DRESSINGS) IMPLANT
GLOVE BIO SURGEON STRL SZ7.5 (GLOVE) ×3 IMPLANT
GLOVE INDICATOR 8.0 STRL GRN (GLOVE) ×3 IMPLANT
GOWN STRL REUS W/ TWL LRG LVL3 (GOWN DISPOSABLE) ×1 IMPLANT
GOWN STRL REUS W/ TWL XL LVL3 (GOWN DISPOSABLE) ×1 IMPLANT
GOWN STRL REUS W/TWL LRG LVL3 (GOWN DISPOSABLE) ×2
GOWN STRL REUS W/TWL XL LVL3 (GOWN DISPOSABLE) ×2
KIT TURNOVER KIT A (KITS) ×3 IMPLANT
LABEL OR SOLS (LABEL) ×3 IMPLANT
NS IRRIG 500ML POUR BTL (IV SOLUTION) ×3 IMPLANT
PACK BASIN MINOR ARMC (MISCELLANEOUS) ×3 IMPLANT
PAD PREP 24X41 OB/GYN DISP (PERSONAL CARE ITEMS) ×3 IMPLANT
STOCKINETTE STRL 6IN 960660 (GAUZE/BANDAGES/DRESSINGS) ×3 IMPLANT
STRIP CLOSURE SKIN 1/2X4 (GAUZE/BANDAGES/DRESSINGS) ×2 IMPLANT
SUT ETHILON 3-0 (SUTURE) ×3 IMPLANT
SUT ETHILON 4-0 (SUTURE) ×2
SUT ETHILON 4-0 FS2 18XMFL BLK (SUTURE) ×1
SUT VIC AB 2-0 CT1 (SUTURE) ×3 IMPLANT
SUT VIC AB 3-0 SH 27 (SUTURE) ×2
SUT VIC AB 3-0 SH 27X BRD (SUTURE) ×1 IMPLANT
SUT VIC AB 4-0 FS2 27 (SUTURE) ×3 IMPLANT
SUT VICRYL+ 3-0 144IN (SUTURE) ×3 IMPLANT
SUTURE ETHLN 4-0 FS2 18XMF BLK (SUTURE) ×1 IMPLANT
SWABSTK COMLB BENZOIN TINCTURE (MISCELLANEOUS) ×3 IMPLANT
SYR CONTROL 10ML (SYRINGE) ×3 IMPLANT

## 2017-08-26 NOTE — OR Nursing (Signed)
Call from [place] room [facility]) advising Dr. Bary Castilla advises he does not need to see prior pt in postop prior to her discharge if she's doing well.  Advised patient of same.  Will discharge to home without MD visit to postop.

## 2017-08-26 NOTE — Anesthesia Procedure Notes (Signed)
Procedure Name: Intubation Date/Time: 08/26/2017 7:32 AM Performed by: Carron Curie, CRNA Pre-anesthesia Checklist: Patient identified, Emergency Drugs available, Suction available, Patient being monitored and Timeout performed Patient Re-evaluated:Patient Re-evaluated prior to induction Oxygen Delivery Method: Circle system utilized Preoxygenation: Pre-oxygenation with 100% oxygen Induction Type: IV induction Ventilation: Mask ventilation without difficulty Laryngoscope Size: McGraph and 3 Grade View: Grade I Tube type: Oral Tube size: 7.0 mm Number of attempts: 1 Airway Equipment and Method: Stylet Placement Confirmation: ETT inserted through vocal cords under direct vision,  positive ETCO2 and breath sounds checked- equal and bilateral Secured at: 21 (at teeth) cm Tube secured with: Tape

## 2017-08-26 NOTE — Anesthesia Post-op Follow-up Note (Signed)
Anesthesia QCDR form completed.        

## 2017-08-26 NOTE — H&P (Signed)
No change in clinical history or exam.  For excision of right back lipoma.

## 2017-08-26 NOTE — Anesthesia Postprocedure Evaluation (Signed)
Anesthesia Post Note  Patient: Natasha Jimenez  Procedure(s) Performed: EXCISION LIPOMA RIGHT FLANK (Right )  Patient location during evaluation: PACU Anesthesia Type: General Level of consciousness: awake and alert and oriented Pain management: pain level controlled Vital Signs Assessment: post-procedure vital signs reviewed and stable Respiratory status: spontaneous breathing, nonlabored ventilation and respiratory function stable Cardiovascular status: blood pressure returned to baseline and stable Postop Assessment: no signs of nausea or vomiting Anesthetic complications: no     Last Vitals:  Vitals:   08/26/17 0927 08/26/17 0936  BP: (!) 114/49   Pulse: 85 71  Resp: 18 11  Temp:  (!) 36.3 C  SpO2: 94% 97%    Last Pain:  Vitals:   08/26/17 0936  TempSrc:   PainSc: 1                  Thursa Emme

## 2017-08-26 NOTE — Discharge Instructions (Signed)

## 2017-08-26 NOTE — Op Note (Signed)
Preoperative diagnosis: Lipoma of the right flank/back.  Postoperative diagnosis: Same.  Operative procedure: Excision of right flank lipoma.  Operating Surgeon: Hervey Ard, MD.  Anesthesia: General endotracheal, Marcaine 0.5%, plain, 30 cc.  Estimated blood loss: Less than 15 cc.  Clinical note: This 62 year old woman has had a gradually enlarging mass on the right flank.  She was admitted for elective excision.  Examination showed a 10 x 15 cm soft tissue mass consistent with a lipoma.  No clear evidence of fixation to the underlying trapezius fascia.  Operative note: The patient underwent general endotracheal anesthesia and was rolled to the left lateral decubitus position supported on a beanbag with an axillary roll.  Appropriate padding of the lower extremities was undertaken and 3 straps were applied.  The beanbag was inflated and good exposure was noted.  The mass lesion on the right posterior flank/back was cleansed with ChloraPrep and draped.  Marcaine was infiltrated for postoperative analgesia.  An 8 cm incision was made overlying the mass and the skin was incised sharply and the remaining dissection completed with electrocautery.  The multilobulated soft tissue mass below the superficial fascia was consistent with a lipoma.  This extended down to but did not invade the underlying trapezius fascia.  A small pod measuring about 3 x 4 cm extended superiorly and this was extracted completely.  The mass was orientated with a short suture cephalad, medium suture medial and a long suture on the superficial surface.  After good hemostasis was obtained with electrocautery the wound was closed in layers with a interrupted 2-0 Vicryl figure-of-eight sutures to the deep layer including a bite of the underlying muscle fascia to obliterate dead space.  The superficial fat was approximated with a running 2-0 Vicryl suture.  The skin was closed with a running 4-0 Vicryl subcuticular suture.  Benzoin  and Steri-Strip followed by a honeycomb dressing was applied.  The patient tolerated the procedure well was taken to recovery room in stable condition.

## 2017-08-26 NOTE — Anesthesia Preprocedure Evaluation (Signed)
Anesthesia Evaluation  Patient identified by MRN, date of birth, ID band Patient awake    Reviewed: Allergy & Precautions, NPO status , Patient's Chart, lab work & pertinent test results  History of Anesthesia Complications Negative for: history of anesthetic complications  Airway Mallampati: II  TM Distance: >3 FB Neck ROM: Full    Dental  (+) Loose, Poor Dentition, Partial Upper,    Pulmonary sleep apnea (likely based on history) , neg COPD, former smoker,    breath sounds clear to auscultation- rhonchi (-) wheezing      Cardiovascular Exercise Tolerance: Good hypertension, Pt. on medications (-) CAD, (-) Past MI and (-) Cardiac Stents  Rhythm:Regular Rate:Normal - Systolic murmurs and - Diastolic murmurs    Neuro/Psych Anxiety negative neurological ROS     GI/Hepatic Neg liver ROS, GERD  ,  Endo/Other  diabetes, Oral Hypoglycemic Agents  Renal/GU negative Renal ROS     Musculoskeletal  (+) Arthritis ,   Abdominal (+) + obese,   Peds  Hematology negative hematology ROS (+)   Anesthesia Other Findings Past Medical History: No date: Anxiety No date: Arthritis No date: Diabetes mellitus without complication ([facility]) No date: Family history of adverse reaction to anesthesia No date: GERD (gastroesophageal reflux disease)     Comment:  OCC-TUMS No date: Hypertension 2006: Ovarian cancer ([place]) No date: Sleep apnea     Comment:  NO CPAP    Reproductive/Obstetrics                             Anesthesia Physical Anesthesia Plan  ASA: III  Anesthesia Plan: General   Post-op Pain Management:    Induction: Intravenous  PONV Risk Score and Plan: 2 and Ondansetron, Midazolam and Dexamethasone  Airway Management Planned: Oral ETT  Additional Equipment:   Intra-op Plan:   Post-operative Plan: Extubation in OR  Informed Consent: I have reviewed the patients History and Physical,  chart, labs and discussed the procedure including the risks, benefits and alternatives for the proposed anesthesia with the patient or authorized representative who has indicated his/her understanding and acceptance.   Dental advisory given  Plan Discussed with: CRNA and Anesthesiologist  Anesthesia Plan Comments:         Anesthesia Quick Evaluation

## 2017-08-26 NOTE — Transfer of Care (Signed)
Immediate Anesthesia Transfer of Care Note  Patient: Natasha Jimenez  Procedure(s) Performed: EXCISION LIPOMA RIGHT FLANK (Right )  Patient Location: PACU  Anesthesia Type:General  Level of Consciousness: awake  Airway & Oxygen Therapy: Patient Spontanous Breathing  Post-op Assessment: Report given to RN  Post vital signs: stable  Last Vitals:  Vitals:   08/26/17 0610 08/26/17 0842  BP: (!) 141/79 131/62  Pulse: 67 75  Resp: 16 13  Temp: 36.8 C (!) 36.2 C  SpO2: 99% 97%    Last Pain:  Vitals:   08/26/17 0610  TempSrc: Oral  PainSc: 1          Complications: No apparent anesthesia complications

## 2017-08-29 LAB — SURGICAL PATHOLOGY

## 2017-08-30 ENCOUNTER — Telehealth: Payer: Self-pay | Admitting: *Deleted

## 2017-08-30 NOTE — Telephone Encounter (Signed)
-----   Message from Robert Bellow, MD sent at 08/30/2017 12:16 PM EDT ----- Please notify the patient that the large flank fatty tumor removed was examined and no evidence of any problems.  Simple fat.  Follow-up as planned. ----- Message ----- From: Interface, Lab In Three Zero One Sent: 08/29/2017  10:51 AM To: Robert Bellow, MD

## 2017-08-30 NOTE — Telephone Encounter (Signed)
Notified patient as instructed, patient agrees. Patient will follow up with Dr.Byrnett on 09/01/17

## 2017-09-01 ENCOUNTER — Encounter: Payer: Self-pay | Admitting: General Surgery

## 2017-09-01 ENCOUNTER — Ambulatory Visit: Payer: BLUE CROSS/BLUE SHIELD | Admitting: General Surgery

## 2017-09-01 ENCOUNTER — Ambulatory Visit (INDEPENDENT_AMBULATORY_CARE_PROVIDER_SITE_OTHER): Payer: BLUE CROSS/BLUE SHIELD | Admitting: General Surgery

## 2017-09-01 VITALS — BP 110/67 | HR 78 | Resp 14 | Ht 63.0 in | Wt 201.0 lb

## 2017-09-01 DIAGNOSIS — D171 Benign lipomatous neoplasm of skin and subcutaneous tissue of trunk: Secondary | ICD-10-CM

## 2017-09-01 NOTE — Progress Notes (Signed)
Patient ID: Natasha Jimenez, female   DOB: 11/24/1955, 62 y.o.   MRN: [id number]  Chief Complaint  Patient presents with  . Routine Post Op    HPI Natasha Jimenez is a 62 y.o. female her for a post op exam from lipoma removal from the back done on 08/26/17. She reports the pain is moderate to mild, some itchyness.  She reports constipation and tiredness after surgery for the past week.  HPI  Past Medical History:  Diagnosis Date  . Anxiety   . Arthritis   . Diabetes mellitus without complication ([place])   . Family history of adverse reaction to anesthesia   . GERD (gastroesophageal reflux disease)    OCC-TUMS  . Hypertension   . Ovarian cancer ([place]) 2006  . Sleep apnea    NO CPAP     Past Surgical History:  Procedure Laterality Date  . ABDOMINAL HYSTERECTOMY  2006  . LIPOMA EXCISION Right 08/26/2017   Procedure: EXCISION LIPOMA RIGHT FLANK;  Surgeon: Robert Bellow, MD;  Location: [facility] ORS;  Service: General;  Laterality: Right;  . NOSE SURGERY      Family History  Problem Relation Age of Onset  . Stomach cancer Father   . Colon cancer Neg Hx   . Breast cancer Neg Hx     Social History Social History   Tobacco Use  . Smoking status: Former Smoker    Packs/day: 0.50    Years: 2.00    Pack years: 1.00    Types: Cigarettes    Last attempt to quit: 06/14/2014    Years since quitting: 3.2  . Smokeless tobacco: Never Used  Substance Use Topics  . Alcohol use: No    Frequency: Never  . Drug use: No    No Known Allergies  Current Outpatient Medications  Medication Sig Dispense Refill  . acetaminophen (TYLENOL) 500 MG tablet Take 1,000 mg by mouth daily as needed for moderate pain or headache.    . Aromatic Inhalants (VICKS VAPOINHALER) INHA Inhale 1 Dose into the lungs at bedtime.     . calcium carbonate (TUMS - DOSED IN MG ELEMENTAL CALCIUM) 500 MG chewable tablet Chew 1 tablet by mouth as needed for indigestion or heartburn.    . estradiol (ESTRACE) 1 MG  tablet Take 1 mg by mouth daily.     Marland Kitchen FLUoxetine (PROZAC) 20 MG capsule Take 20 mg by mouth every morning.     Marland Kitchen HYDROcodone-acetaminophen (NORCO/VICODIN) 5-325 MG tablet Take 1 tablet by mouth every 4 (four) hours as needed for moderate pain. 20 tablet 0  . ibuprofen (ADVIL,MOTRIN) 200 MG tablet Take 400 mg by mouth daily as needed for headache or moderate pain.    Marland Kitchen lisinopril-hydrochlorothiazide (PRINZIDE,ZESTORETIC) 20-25 MG tablet Take 1 tablet by mouth daily.     . metFORMIN (GLUCOPHAGE-XR) 500 MG 24 hr tablet Take 500 mg by mouth daily with breakfast.     . OVER THE COUNTER MEDICATION Take 1 tablet by mouth daily. Thyroid support otc supplement     . rosuvastatin (CRESTOR) 20 MG tablet Take 20 mg by mouth at bedtime.      No current facility-administered medications for this visit.     Review of Systems Review of Systems  Constitutional: Negative.   Respiratory: Negative.   Cardiovascular: Negative.     Blood pressure 110/67, pulse 78, resp. rate 14, height 5\' 3"  (1.6 m), weight 201 lb (91.2 kg).  Physical Exam Physical Exam  Constitutional: She is oriented to  person, place, and time. She appears well-developed and well-nourished.  Pulmonary/Chest:      Neurological: She is alert and oriented to person, place, and time.  Skin: Skin is warm and dry.  Psychiatric: She has a normal mood and affect.    Data Reviewed DIAGNOSIS:  A. LIPOMA, RIGHT FLANK; EXCISION:  - CONSISTENT WITH LIPOMA.  - NEGATIVE FOR MALIGNANCY.   Assessment    Doing well post lipoma excision.  No evidence of seroma formation.    Plan    Follow up as needed     HPI, Physical Exam, Assessment and Plan have been scribed under the direction and in the presence of Robert Bellow, MD  Concepcion Living, LPN  I have completed the exam and reviewed the above documentation for accuracy and completeness.  I agree with the above.  Haematologist has been used and any errors in dictation or  transcription are unintentional.  Hervey Ard, M.D., F.A.C.S. [place] Gleason Angelic Schnelle 09/01/2017, 1:10 PM

## 2017-09-01 NOTE — Patient Instructions (Signed)
The butterfly bandages will loosen up in about a week. You may remove them when they start curling up. Follow up as needed.

## 2019-04-17 ENCOUNTER — Ambulatory Visit
Admission: RE | Admit: 2019-04-17 | Discharge: 2019-04-17 | Disposition: A | Discharge status: Home / Self Care | Payer: Medicare Other | Source: Ambulatory Visit | Attending: Student | Admitting: Student

## 2019-04-17 ENCOUNTER — Other Ambulatory Visit
Admission: RE | Admit: 2019-04-17 | Discharge: 2019-04-17 | Disposition: A | Discharge status: Home / Self Care | Payer: Medicare Other | Source: Ambulatory Visit | Attending: Pediatrics | Admitting: Pediatrics

## 2019-04-17 ENCOUNTER — Other Ambulatory Visit: Payer: Self-pay

## 2019-04-17 ENCOUNTER — Other Ambulatory Visit: Payer: Self-pay | Admitting: Student

## 2019-04-17 ENCOUNTER — Encounter (INDEPENDENT_AMBULATORY_CARE_PROVIDER_SITE_OTHER): Payer: Self-pay

## 2019-04-17 DIAGNOSIS — R600 Localized edema: Secondary | ICD-10-CM | POA: Diagnosis present

## 2019-04-17 LAB — BRAIN NATRIURETIC PEPTIDE: B Natriuretic Peptide: 123 pg/mL — ABNORMAL HIGH (ref 0.0–100.0)

## 2019-07-17 ENCOUNTER — Ambulatory Visit: Payer: Medicare Other | Attending: Internal Medicine

## 2019-07-17 DIAGNOSIS — Z20822 Contact with and (suspected) exposure to covid-19: Secondary | ICD-10-CM

## 2019-07-18 LAB — NOVEL CORONAVIRUS, NAA: SARS-CoV-2, NAA: NOT DETECTED

## 2019-08-28 ENCOUNTER — Other Ambulatory Visit: Payer: Self-pay | Admitting: Internal Medicine

## 2019-08-28 DIAGNOSIS — Z0000 Encounter for general adult medical examination without abnormal findings: Secondary | ICD-10-CM

## 2019-08-28 DIAGNOSIS — Z1231 Encounter for screening mammogram for malignant neoplasm of breast: Secondary | ICD-10-CM

## 2019-12-25 DIAGNOSIS — F325 Major depressive disorder, single episode, in full remission: Secondary | ICD-10-CM | POA: Diagnosis not present

## 2019-12-25 DIAGNOSIS — E7800 Pure hypercholesterolemia, unspecified: Secondary | ICD-10-CM | POA: Diagnosis not present

## 2019-12-25 DIAGNOSIS — E119 Type 2 diabetes mellitus without complications: Secondary | ICD-10-CM | POA: Diagnosis not present

## 2019-12-25 DIAGNOSIS — I10 Essential (primary) hypertension: Secondary | ICD-10-CM | POA: Diagnosis not present

## 2019-12-25 DIAGNOSIS — G4733 Obstructive sleep apnea (adult) (pediatric): Secondary | ICD-10-CM | POA: Diagnosis not present

## 2019-12-25 DIAGNOSIS — Z87891 Personal history of nicotine dependence: Secondary | ICD-10-CM | POA: Diagnosis not present

## 2020-01-23 DIAGNOSIS — R1032 Left lower quadrant pain: Secondary | ICD-10-CM | POA: Diagnosis not present

## 2020-01-23 DIAGNOSIS — M47814 Spondylosis without myelopathy or radiculopathy, thoracic region: Secondary | ICD-10-CM | POA: Diagnosis not present

## 2020-01-23 DIAGNOSIS — M5442 Lumbago with sciatica, left side: Secondary | ICD-10-CM | POA: Diagnosis not present

## 2020-01-23 DIAGNOSIS — N939 Abnormal uterine and vaginal bleeding, unspecified: Secondary | ICD-10-CM | POA: Diagnosis not present

## 2020-01-23 DIAGNOSIS — M47816 Spondylosis without myelopathy or radiculopathy, lumbar region: Secondary | ICD-10-CM | POA: Diagnosis not present

## 2020-01-23 DIAGNOSIS — K5900 Constipation, unspecified: Secondary | ICD-10-CM | POA: Diagnosis not present

## 2020-01-23 DIAGNOSIS — R1030 Lower abdominal pain, unspecified: Secondary | ICD-10-CM | POA: Diagnosis not present

## 2020-01-23 DIAGNOSIS — M545 Low back pain: Secondary | ICD-10-CM | POA: Diagnosis not present

## 2020-01-28 DIAGNOSIS — G4733 Obstructive sleep apnea (adult) (pediatric): Secondary | ICD-10-CM | POA: Diagnosis not present

## 2020-01-28 DIAGNOSIS — J342 Deviated nasal septum: Secondary | ICD-10-CM | POA: Diagnosis not present

## 2020-01-31 DIAGNOSIS — Z8543 Personal history of malignant neoplasm of ovary: Secondary | ICD-10-CM | POA: Diagnosis not present

## 2020-01-31 DIAGNOSIS — N950 Postmenopausal bleeding: Secondary | ICD-10-CM | POA: Diagnosis not present

## 2020-01-31 DIAGNOSIS — N939 Abnormal uterine and vaginal bleeding, unspecified: Secondary | ICD-10-CM | POA: Diagnosis not present

## 2020-01-31 DIAGNOSIS — N898 Other specified noninflammatory disorders of vagina: Secondary | ICD-10-CM | POA: Diagnosis not present

## 2020-03-19 ENCOUNTER — Ambulatory Visit: Payer: Medicare HMO | Attending: Otolaryngology

## 2020-03-19 DIAGNOSIS — Z6837 Body mass index (BMI) 37.0-37.9, adult: Secondary | ICD-10-CM | POA: Insufficient documentation

## 2020-03-19 DIAGNOSIS — R0683 Snoring: Secondary | ICD-10-CM | POA: Diagnosis not present

## 2020-03-19 DIAGNOSIS — R0681 Apnea, not elsewhere classified: Secondary | ICD-10-CM | POA: Diagnosis present

## 2020-03-19 DIAGNOSIS — E669 Obesity, unspecified: Secondary | ICD-10-CM | POA: Insufficient documentation

## 2020-03-19 DIAGNOSIS — G4733 Obstructive sleep apnea (adult) (pediatric): Secondary | ICD-10-CM | POA: Diagnosis not present

## 2020-03-20 ENCOUNTER — Other Ambulatory Visit: Payer: Self-pay

## 2020-04-28 DIAGNOSIS — F325 Major depressive disorder, single episode, in full remission: Secondary | ICD-10-CM | POA: Diagnosis not present

## 2020-04-28 DIAGNOSIS — I10 Essential (primary) hypertension: Secondary | ICD-10-CM | POA: Diagnosis not present

## 2020-04-28 DIAGNOSIS — E119 Type 2 diabetes mellitus without complications: Secondary | ICD-10-CM | POA: Diagnosis not present

## 2020-04-28 DIAGNOSIS — E7800 Pure hypercholesterolemia, unspecified: Secondary | ICD-10-CM | POA: Diagnosis not present

## 2020-04-28 DIAGNOSIS — N950 Postmenopausal bleeding: Secondary | ICD-10-CM | POA: Diagnosis not present

## 2020-05-26 ENCOUNTER — Ambulatory Visit: Payer: Medicare HMO | Attending: Otolaryngology

## 2020-05-26 DIAGNOSIS — G4733 Obstructive sleep apnea (adult) (pediatric): Secondary | ICD-10-CM | POA: Insufficient documentation

## 2020-05-27 ENCOUNTER — Other Ambulatory Visit: Payer: Self-pay

## 2020-06-26 DIAGNOSIS — G4733 Obstructive sleep apnea (adult) (pediatric): Secondary | ICD-10-CM | POA: Diagnosis not present

## 2020-07-02 DIAGNOSIS — G4733 Obstructive sleep apnea (adult) (pediatric): Secondary | ICD-10-CM | POA: Diagnosis not present

## 2020-07-11 DIAGNOSIS — Z20822 Contact with and (suspected) exposure to covid-19: Secondary | ICD-10-CM | POA: Diagnosis not present

## 2020-07-11 DIAGNOSIS — Z03818 Encounter for observation for suspected exposure to other biological agents ruled out: Secondary | ICD-10-CM | POA: Diagnosis not present

## 2020-07-16 DIAGNOSIS — Z03818 Encounter for observation for suspected exposure to other biological agents ruled out: Secondary | ICD-10-CM | POA: Diagnosis not present

## 2020-07-16 DIAGNOSIS — Z20822 Contact with and (suspected) exposure to covid-19: Secondary | ICD-10-CM | POA: Diagnosis not present

## 2020-07-27 DIAGNOSIS — G4733 Obstructive sleep apnea (adult) (pediatric): Secondary | ICD-10-CM | POA: Diagnosis not present

## 2020-08-04 DIAGNOSIS — H524 Presbyopia: Secondary | ICD-10-CM | POA: Diagnosis not present

## 2020-08-13 DIAGNOSIS — R0789 Other chest pain: Secondary | ICD-10-CM | POA: Diagnosis not present

## 2020-08-13 DIAGNOSIS — R21 Rash and other nonspecific skin eruption: Secondary | ICD-10-CM | POA: Diagnosis not present

## 2020-08-13 DIAGNOSIS — E119 Type 2 diabetes mellitus without complications: Secondary | ICD-10-CM | POA: Diagnosis not present

## 2021-02-19 ENCOUNTER — Encounter: Payer: Self-pay | Admitting: Ophthalmology

## 2021-03-02 NOTE — Discharge Instructions (Signed)

## 2021-03-04 ENCOUNTER — Ambulatory Visit
Admission: RE | Admit: 2021-03-04 | Discharge: 2021-03-04 | Disposition: A | Discharge status: Home / Self Care | Payer: Medicare PPO | Attending: Ophthalmology | Admitting: Ophthalmology

## 2021-03-04 ENCOUNTER — Encounter: Payer: Self-pay | Admitting: Ophthalmology

## 2021-03-04 ENCOUNTER — Ambulatory Visit: Payer: Medicare PPO | Admitting: Anesthesiology

## 2021-03-04 ENCOUNTER — Encounter
Admission: RE | Disposition: A | Discharge status: Home / Self Care | Payer: Self-pay | Source: Home / Self Care | Attending: Ophthalmology

## 2021-03-04 ENCOUNTER — Other Ambulatory Visit: Payer: Self-pay

## 2021-03-04 DIAGNOSIS — Z79890 Hormone replacement therapy (postmenopausal): Secondary | ICD-10-CM | POA: Insufficient documentation

## 2021-03-04 DIAGNOSIS — E1136 Type 2 diabetes mellitus with diabetic cataract: Secondary | ICD-10-CM | POA: Diagnosis not present

## 2021-03-04 DIAGNOSIS — Z7984 Long term (current) use of oral hypoglycemic drugs: Secondary | ICD-10-CM | POA: Diagnosis not present

## 2021-03-04 DIAGNOSIS — Z79899 Other long term (current) drug therapy: Secondary | ICD-10-CM | POA: Diagnosis not present

## 2021-03-04 DIAGNOSIS — Z8543 Personal history of malignant neoplasm of ovary: Secondary | ICD-10-CM | POA: Insufficient documentation

## 2021-03-04 DIAGNOSIS — H2512 Age-related nuclear cataract, left eye: Secondary | ICD-10-CM | POA: Insufficient documentation

## 2021-03-04 DIAGNOSIS — Z87891 Personal history of nicotine dependence: Secondary | ICD-10-CM | POA: Insufficient documentation

## 2021-03-04 HISTORY — PX: CATARACT EXTRACTION W/PHACO: SHX586

## 2021-03-04 LAB — GLUCOSE, CAPILLARY
Glucose-Capillary: 150 mg/dL — ABNORMAL HIGH (ref 70–99)
Glucose-Capillary: 169 mg/dL — ABNORMAL HIGH (ref 70–99)

## 2021-03-04 SURGERY — PHACOEMULSIFICATION, CATARACT, WITH IOL INSERTION
Anesthesia: General | Site: Eye | Laterality: Left

## 2021-03-04 MED ORDER — BRIMONIDINE TARTRATE-TIMOLOL 0.2-0.5 % OP SOLN
OPHTHALMIC | Status: DC | PRN
  Administered 2021-03-04: 1 [drp] via OPHTHALMIC

## 2021-03-04 MED ORDER — DEXAMETHASONE SODIUM PHOSPHATE 4 MG/ML IJ SOLN
INTRAMUSCULAR | Status: DC | PRN
  Administered 2021-03-04: 4 mg via INTRAVENOUS

## 2021-03-04 MED ORDER — DEXMEDETOMIDINE (PRECEDEX) IN NS 20 MCG/5ML (4 MCG/ML) IV SYRINGE
PREFILLED_SYRINGE | INTRAVENOUS | Status: DC | PRN
  Administered 2021-03-04 (×2): 10 ug via INTRAVENOUS

## 2021-03-04 MED ORDER — TETRACAINE HCL 0.5 % OP SOLN
1.0000 [drp] | OPHTHALMIC | Status: DC | PRN
  Administered 2021-03-04 (×3): 1 [drp] via OPHTHALMIC

## 2021-03-04 MED ORDER — SIGHTPATH DOSE#1 BSS IO SOLN
INTRAOCULAR | Status: DC | PRN
  Administered 2021-03-04: 15 mL

## 2021-03-04 MED ORDER — SIGHTPATH DOSE#1 BSS IO SOLN
INTRAOCULAR | Status: DC | PRN
  Administered 2021-03-04: 1 mL via INTRAMUSCULAR

## 2021-03-04 MED ORDER — ACETAMINOPHEN 160 MG/5ML PO SOLN: 975.0000 mg | Freq: Once | ORAL | Status: AC | PRN

## 2021-03-04 MED ORDER — ARMC OPHTHALMIC DILATING DROPS
1.0000 "application " | OPHTHALMIC | Status: DC | PRN
  Administered 2021-03-04 (×3): 1 via OPHTHALMIC

## 2021-03-04 MED ORDER — SIGHTPATH DOSE#1 NA HYALUR & NA CHOND-NA HYALUR IO KIT
PACK | INTRAOCULAR | Status: DC | PRN
  Administered 2021-03-04: 1 via OPHTHALMIC

## 2021-03-04 MED ORDER — ACETAMINOPHEN 500 MG PO TABS
1000.0000 mg | ORAL_TABLET | Freq: Once | ORAL | Status: AC | PRN
  Administered 2021-03-04: 1000 mg via ORAL

## 2021-03-04 MED ORDER — CEFUROXIME OPHTHALMIC INJECTION 1 MG/0.1 ML
INJECTION | OPHTHALMIC | Status: DC | PRN
  Administered 2021-03-04: 1 mg via OPHTHALMIC

## 2021-03-04 MED ORDER — MIDAZOLAM HCL 2 MG/2ML IJ SOLN
INTRAMUSCULAR | Status: DC | PRN
  Administered 2021-03-04: 2 mg via INTRAVENOUS

## 2021-03-04 MED ORDER — ONDANSETRON HCL 4 MG/2ML IJ SOLN
4.0000 mg | Freq: Once | INTRAMUSCULAR | Status: AC
  Administered 2021-03-04: 4 mg via INTRAVENOUS

## 2021-03-04 MED ORDER — LACTATED RINGERS IV SOLN: INTRAVENOUS | Status: DC

## 2021-03-04 MED ORDER — ONDANSETRON HCL 4 MG/2ML IJ SOLN: 4.0000 mg | Freq: Once | INTRAMUSCULAR | Status: DC | PRN

## 2021-03-04 SURGICAL SUPPLY — 13 items
GLOVE SRG 8 PF TXTR STRL LF DI (GLOVE) ×1 IMPLANT
GLOVE SURG ENC TEXT LTX SZ7.5 (GLOVE) ×2 IMPLANT
GLOVE SURG UNDER POLY LF SZ8 (GLOVE) ×2
GOWN STRL REUS W/ TWL LRG LVL3 (GOWN DISPOSABLE) ×2 IMPLANT
GOWN STRL REUS W/TWL LRG LVL3 (GOWN DISPOSABLE) ×4
LENS IOL TECNIS EYHANCE 20.0 (Intraocular Lens) ×2 IMPLANT
MARKER SKIN DUAL TIP RULER LAB (MISCELLANEOUS) ×2 IMPLANT
NEEDLE FILTER BLUNT 18X 1/2SAF (NEEDLE) ×2
NEEDLE FILTER BLUNT 18X1 1/2 (NEEDLE) ×2 IMPLANT
SYR 3ML LL SCALE MARK (SYRINGE) ×4 IMPLANT
SYR TB 1ML LUER SLIP (SYRINGE) ×2 IMPLANT
WATER STERILE IRR 250ML POUR (IV SOLUTION) ×2 IMPLANT
WIPE NON LINTING 3.25X3.25 (MISCELLANEOUS) ×2 IMPLANT

## 2021-03-04 NOTE — Transfer of Care (Signed)
Immediate Anesthesia Transfer of Care Note  Patient: Natasha Jimenez  Procedure(s) Performed: CATARACT EXTRACTION PHACO AND INTRAOCULAR LENS PLACEMENT (IOC) LEFT (Left: Eye)  Patient Location: PACU  Anesthesia Type: General  Level of Consciousness: awake, alert  and patient cooperative  Airway and Oxygen Therapy: Patient Spontanous Breathing and Patient connected to supplemental oxygen  Post-op Assessment: Post-op Vital signs reviewed, Patient's Cardiovascular Status Stable, Respiratory Function Stable, Patent Airway and No signs of Nausea or vomiting  Post-op Vital Signs: Reviewed and stable  Complications: No notable events documented.

## 2021-03-04 NOTE — Anesthesia Postprocedure Evaluation (Signed)
Anesthesia Post Note  Patient: Natasha Jimenez  Procedure(s) Performed: CATARACT EXTRACTION PHACO AND INTRAOCULAR LENS PLACEMENT (IOC) LEFT (Left: Eye)     Patient location during evaluation: PACU Anesthesia Type: MAC Level of consciousness: awake and alert Pain management: pain level controlled Vital Signs Assessment: post-procedure vital signs reviewed and stable Respiratory status: spontaneous breathing, nonlabored ventilation and respiratory function stable Cardiovascular status: stable and blood pressure returned to baseline Postop Assessment: no apparent nausea or vomiting Anesthetic complications: no   No notable events documented.  April Manson

## 2021-03-04 NOTE — Anesthesia Procedure Notes (Signed)
Procedure Name: MAC Date/Time: 03/04/2021 8:23 AM Performed by: Jeannene Patella, CRNA Pre-anesthesia Checklist: Patient identified, Emergency Drugs available, Suction available, Timeout performed and Patient being monitored Patient Re-evaluated:Patient Re-evaluated prior to induction Oxygen Delivery Method: Nasal cannula Placement Confirmation: positive ETCO2

## 2021-03-04 NOTE — Op Note (Signed)
  OPERATIVE NOTE  Natasha Jimenez [id number] 03/04/2021   PREOPERATIVE DIAGNOSIS:  Nuclear sclerotic cataract left eye. H25.12   POSTOPERATIVE DIAGNOSIS:    Nuclear sclerotic cataract left eye.     PROCEDURE:  Phacoemusification with posterior chamber intraocular lens placement of the left eye  Ultrasound time: Procedure(s) with comments: CATARACT EXTRACTION PHACO AND INTRAOCULAR LENS PLACEMENT (IOC) LEFT (Left) - 2.87 00:37.5  LENS:   Implant Name Type Inv. Item Serial No. Manufacturer Lot No. LRB No. Used Action  LENS IOL TECNIS EYHANCE 20.0 - [id number] Intraocular Lens LENS IOL TECNIS EYHANCE 20.0 [id number] JOHNSON   Left 1 Implanted      SURGEON:  Wyonia Hough, MD   ANESTHESIA:  Topical with tetracaine drops and 2% Xylocaine jelly, augmented with 1% preservative-free intracameral lidocaine.    COMPLICATIONS:  None.   DESCRIPTION OF PROCEDURE:  The patient was identified in the holding room and transported to the operating room and placed in the supine position under the operating microscope.  The left eye was identified as the operative eye and it was prepped and draped in the usual sterile ophthalmic fashion.   A 1 millimeter clear-corneal paracentesis was made at the 1:30 position.  0.5 ml of preservative-free 1% lidocaine was injected into the anterior chamber.  The anterior chamber was filled with Viscoat viscoelastic.  A 2.4 millimeter keratome was used to make a near-clear corneal incision at the 10:30 position.  .  A curvilinear capsulorrhexis was made with a cystotome and capsulorrhexis forceps.  Balanced salt solution was used to hydrodissect and hydrodelineate the nucleus.   Phacoemulsification was then used in stop and chop fashion to remove the lens nucleus and epinucleus.  The remaining cortex was then removed using the irrigation and aspiration handpiece. Provisc was then placed into the capsular bag to distend it for lens placement.  A lens was then  injected into the capsular bag.  The remaining viscoelastic was aspirated.   Wounds were hydrated with balanced salt solution.  The anterior chamber was inflated to a physiologic pressure with balanced salt solution.  No wound leaks were noted. Cefuroxime 0.1 ml of a 10mg /ml solution was injected into the anterior chamber for a dose of 1 mg of intracameral antibiotic at the completion of the case.   Timolol and Brimonidine drops were applied to the eye.  The patient was taken to the recovery room in stable condition without complications of anesthesia or surgery.  [name] 03/04/2021, 8:30 AM

## 2021-03-04 NOTE — H&P (Signed)
[facility]   Primary Care Physician:  Kirk Ruths, MD Ophthalmologist: Dr. Leandrew Koyanagi  Pre-Procedure History & Physical: HPI:  Natasha Jimenez is a 65 y.o. female here for ophthalmic surgery.   Past Medical History:  Diagnosis Date   Anxiety    Arthritis    Diabetes mellitus without complication ([place])    Family history of adverse reaction to anesthesia    GERD (gastroesophageal reflux disease)    OCC-TUMS   Hypertension    Ovarian cancer ([place]) 2006   Sleep apnea    NO CPAP     Past Surgical History:  Procedure Laterality Date   ABDOMINAL HYSTERECTOMY  2006   LIPOMA EXCISION Right 08/26/2017   Procedure: EXCISION LIPOMA RIGHT FLANK;  Surgeon: Robert Bellow, MD;  Location: [facility] ORS;  Service: General;  Laterality: Right;   NOSE SURGERY      Prior to Admission medications   Medication Sig Start Date End Date Taking? Authorizing Provider  acetaminophen (TYLENOL) 500 MG tablet Take 1,000 mg by mouth daily as needed for moderate pain or headache.   Yes [provider]  Aromatic Inhalants (VICKS VAPOINHALER) INHA Inhale 1 Dose into the lungs at bedtime.   Yes [provider]  calcium carbonate (TUMS - DOSED IN MG ELEMENTAL CALCIUM) 500 MG chewable tablet Chew 1 tablet by mouth as needed for indigestion or heartburn.   Yes [provider]  empagliflozin (JARDIANCE) 10 MG TABS tablet Take 10 mg by mouth daily.   Yes [provider]  estradiol (ESTRACE) 1 MG tablet Take 1 mg by mouth daily.  07/27/17  Yes [provider]  FLUoxetine (PROZAC) 20 MG capsule Take 20 mg by mouth every morning.  07/27/17  Yes [provider]  ibuprofen (ADVIL,MOTRIN) 200 MG tablet Take 400 mg by mouth daily as needed for headache or moderate pain.   Yes [provider]  lisinopril-hydrochlorothiazide (PRINZIDE,ZESTORETIC) 20-25 MG tablet Take 1 tablet by mouth daily.  07/27/17  Yes [provider]   metFORMIN (GLUCOPHAGE-XR) 500 MG 24 hr tablet Take 500 mg by mouth daily with breakfast.  07/27/17  Yes [provider]  OVER THE COUNTER MEDICATION Take 1 tablet by mouth daily. Thyroid support otc supplement    Yes [provider]  rosuvastatin (CRESTOR) 20 MG tablet Take 20 mg by mouth at bedtime.  07/27/17 07/27/18  [provider]    Allergies as of 02/04/2021   (No Known Allergies)    Family History  Problem Relation Age of Onset   Stomach cancer Father    Colon cancer Neg Hx    Breast cancer Neg Hx     Social History   Socioeconomic History   Marital status: Married    Spouse name: Not on file   Number of children: Not on file   Years of education: Not on file   Highest education level: Not on file  Occupational History   Not on file  Tobacco Use   Smoking status: Former    Packs/day: 0.50    Years: 2.00    Pack years: 1.00    Types: Cigarettes    Quit date: 06/14/2014    Years since quitting: 6.7   Smokeless tobacco: Never  Vaping Use   Vaping Use: Never used  Substance and Sexual Activity   Alcohol use: No   Drug use: No   Sexual activity: Not on file  Other Topics Concern   Not on file  Social History  Narrative   Not on file   Social Determinants of Health   Financial Resource Strain: Not on file  Food Insecurity: Not on file  Transportation Needs: Not on file  Physical Activity: Not on file  Stress: Not on file  Social Connections: Not on file  Intimate Partner Violence: Not on file    Review of Systems: See HPI, otherwise negative ROS  Physical Exam: BP 103/61   Pulse 71   Temp (!) 97.3 F (36.3 C) (Temporal)   Resp 20   Ht 5\' 3"  (1.6 m)   Wt 94.8 kg   SpO2 96%   BMI 37.02 kg/m  General:   Alert,  pleasant and cooperative in NAD Head:  Normocephalic and atraumatic. Lungs:  Clear to auscultation.    Heart:  Regular rate and rhythm.   Impression/Plan: Natasha Jimenez is here for ophthalmic  surgery.  Risks, benefits, limitations, and alternatives regarding ophthalmic surgery have been reviewed with the patient.  Questions have been answered.  All parties agreeable.   Leandrew Koyanagi, MD  03/04/2021, 7:37 AM

## 2021-03-04 NOTE — Anesthesia Preprocedure Evaluation (Addendum)
Anesthesia Evaluation  Patient identified by MRN, date of birth, ID band Patient awake    Reviewed: Allergy & Precautions, H&P , NPO status , Patient's Chart, lab work & pertinent test results, reviewed documented beta blocker date and time   Airway Mallampati: III  TM Distance: >3 FB Neck ROM: full    Dental no notable dental hx.    Pulmonary sleep apnea , former smoker,    Pulmonary exam normal breath sounds clear to auscultation       Cardiovascular Exercise Tolerance: Good hypertension, negative cardio ROS   Rhythm:regular Rate:Normal     Neuro/Psych Anxiety negative neurological ROS  negative psych ROS   GI/Hepatic Neg liver ROS, GERD  ,  Endo/Other  negative endocrine ROSdiabetes  Renal/GU negative Renal ROS  negative genitourinary   Musculoskeletal   Abdominal   Peds  Hematology negative hematology ROS (+)   Anesthesia Other Findings   Reproductive/Obstetrics negative OB ROS                            Anesthesia Physical Anesthesia Plan  ASA: 3  Anesthesia Plan: MAC   Post-op Pain Management:    Induction:   PONV Risk Score and Plan: TIVA, Midazolam and Treatment may vary due to age or medical condition  Airway Management Planned:   Additional Equipment:   Intra-op Plan:   Post-operative Plan:   Informed Consent: I have reviewed the patients History and Physical, chart, labs and discussed the procedure including the risks, benefits and alternatives for the proposed anesthesia with the patient or authorized representative who has indicated his/her understanding and acceptance.       Plan Discussed with: CRNA  Anesthesia Plan Comments:        Anesthesia Quick Evaluation

## 2021-03-05 ENCOUNTER — Encounter: Payer: Self-pay | Admitting: Ophthalmology

## 2021-03-18 ENCOUNTER — Ambulatory Visit
Admission: RE | Admit: 2021-03-18 | Discharge: 2021-03-18 | Disposition: A | Discharge status: Home / Self Care | Payer: Medicare PPO | Attending: Ophthalmology | Admitting: Ophthalmology

## 2021-03-18 ENCOUNTER — Encounter
Admission: RE | Disposition: A | Discharge status: Home / Self Care | Payer: Self-pay | Source: Home / Self Care | Attending: Ophthalmology

## 2021-03-18 ENCOUNTER — Ambulatory Visit: Payer: Medicare PPO | Admitting: Anesthesiology

## 2021-03-18 ENCOUNTER — Encounter: Payer: Self-pay | Admitting: Ophthalmology

## 2021-03-18 ENCOUNTER — Other Ambulatory Visit: Payer: Self-pay

## 2021-03-18 DIAGNOSIS — Z79899 Other long term (current) drug therapy: Secondary | ICD-10-CM | POA: Diagnosis not present

## 2021-03-18 DIAGNOSIS — H2511 Age-related nuclear cataract, right eye: Secondary | ICD-10-CM | POA: Diagnosis not present

## 2021-03-18 DIAGNOSIS — E1136 Type 2 diabetes mellitus with diabetic cataract: Secondary | ICD-10-CM | POA: Insufficient documentation

## 2021-03-18 DIAGNOSIS — Z87891 Personal history of nicotine dependence: Secondary | ICD-10-CM | POA: Insufficient documentation

## 2021-03-18 DIAGNOSIS — Z7984 Long term (current) use of oral hypoglycemic drugs: Secondary | ICD-10-CM | POA: Insufficient documentation

## 2021-03-18 HISTORY — PX: CATARACT EXTRACTION W/PHACO: SHX586

## 2021-03-18 LAB — GLUCOSE, CAPILLARY
Glucose-Capillary: 189 mg/dL — ABNORMAL HIGH (ref 70–99)
Glucose-Capillary: 188 mg/dL — ABNORMAL HIGH (ref 70–99)

## 2021-03-18 SURGERY — PHACOEMULSIFICATION, CATARACT, WITH IOL INSERTION
Anesthesia: Monitor Anesthesia Care | Site: Eye | Laterality: Right

## 2021-03-18 MED ORDER — TETRACAINE HCL 0.5 % OP SOLN
1.0000 [drp] | OPHTHALMIC | Status: DC | PRN
  Administered 2021-03-18 (×3): 1 [drp] via OPHTHALMIC

## 2021-03-18 MED ORDER — SIGHTPATH DOSE#1 NA HYALUR & NA CHOND-NA HYALUR IO KIT
PACK | INTRAOCULAR | Status: DC | PRN
  Administered 2021-03-18: 1 via OPHTHALMIC

## 2021-03-18 MED ORDER — MIDAZOLAM HCL 2 MG/2ML IJ SOLN
INTRAMUSCULAR | Status: DC | PRN
  Administered 2021-03-18: 2 mg via INTRAVENOUS

## 2021-03-18 MED ORDER — LACTATED RINGERS IV SOLN: INTRAVENOUS | Status: DC

## 2021-03-18 MED ORDER — SIGHTPATH DOSE#1 BSS IO SOLN
INTRAOCULAR | Status: DC | PRN
  Administered 2021-03-18: 2 mL

## 2021-03-18 MED ORDER — ONDANSETRON HCL 4 MG/2ML IJ SOLN
INTRAMUSCULAR | Status: DC | PRN
  Administered 2021-03-18: 4 mg via INTRAVENOUS

## 2021-03-18 MED ORDER — OXYCODONE HCL 5 MG PO TABS: 5.0000 mg | ORAL_TABLET | Freq: Once | ORAL | Status: DC | PRN

## 2021-03-18 MED ORDER — CEFUROXIME OPHTHALMIC INJECTION 1 MG/0.1 ML
INJECTION | OPHTHALMIC | Status: DC | PRN
  Administered 2021-03-18: 0.1 mL via INTRACAMERAL

## 2021-03-18 MED ORDER — ARMC OPHTHALMIC DILATING DROPS
1.0000 "application " | OPHTHALMIC | Status: DC | PRN
  Administered 2021-03-18 (×3): 1 via OPHTHALMIC

## 2021-03-18 MED ORDER — OXYCODONE HCL 5 MG/5ML PO SOLN: 5.0000 mg | Freq: Once | ORAL | Status: DC | PRN

## 2021-03-18 MED ORDER — BRIMONIDINE TARTRATE-TIMOLOL 0.2-0.5 % OP SOLN
OPHTHALMIC | Status: DC | PRN
  Administered 2021-03-18: 1 [drp] via OPHTHALMIC

## 2021-03-18 MED ORDER — SIGHTPATH DOSE#1 BSS IO SOLN
INTRAOCULAR | Status: DC | PRN
  Administered 2021-03-18: 15 mL via INTRAOCULAR

## 2021-03-18 MED ORDER — SIGHTPATH DOSE#1 BSS IO SOLN
INTRAOCULAR | Status: DC | PRN
  Administered 2021-03-18: 58 mL via OPHTHALMIC

## 2021-03-18 MED ORDER — FENTANYL CITRATE (PF) 100 MCG/2ML IJ SOLN
INTRAMUSCULAR | Status: DC | PRN
  Administered 2021-03-18: 50 ug via INTRAVENOUS

## 2021-03-18 SURGICAL SUPPLY — 23 items
CANNULA ANT/CHMB 27GA (MISCELLANEOUS) IMPLANT
GLOVE SRG 8 PF TXTR STRL LF DI (GLOVE) ×1 IMPLANT
GLOVE SURG ENC TEXT LTX SZ7.5 (GLOVE) ×2 IMPLANT
GLOVE SURG GAMMEX PI TX LF 7.5 (GLOVE) IMPLANT
GLOVE SURG UNDER POLY LF SZ8 (GLOVE) ×2
GOWN STRL REUS W/ TWL LRG LVL3 (GOWN DISPOSABLE) ×2 IMPLANT
GOWN STRL REUS W/TWL LRG LVL3 (GOWN DISPOSABLE) ×4
LENS IOL TECNIS EYHANCE 23.0 (Intraocular Lens) ×2 IMPLANT
MARKER SKIN DUAL TIP RULER LAB (MISCELLANEOUS) ×2 IMPLANT
NDL RETROBULBAR .5 NSTRL (NEEDLE) IMPLANT
NEEDLE CAPSULORHEX 25GA (NEEDLE) IMPLANT
NEEDLE FILTER BLUNT 18X 1/2SAF (NEEDLE) ×2
NEEDLE FILTER BLUNT 18X1 1/2 (NEEDLE) ×2 IMPLANT
PACK EYE AFTER SURG (MISCELLANEOUS) IMPLANT
RING MALYGIN 7.0 (MISCELLANEOUS) IMPLANT
SUT ETHILON 10-0 CS-B-6CS-B-6 (SUTURE)
SUT VICRYL  9 0 (SUTURE)
SUT VICRYL 9 0 (SUTURE) IMPLANT
SUTURE EHLN 10-0 CS-B-6CS-B-6 (SUTURE) IMPLANT
SYR 3ML LL SCALE MARK (SYRINGE) ×4 IMPLANT
SYR TB 1ML LUER SLIP (SYRINGE) ×2 IMPLANT
WATER STERILE IRR 250ML POUR (IV SOLUTION) ×2 IMPLANT
WIPE NON LINTING 3.25X3.25 (MISCELLANEOUS) ×2 IMPLANT

## 2021-03-18 NOTE — Anesthesia Preprocedure Evaluation (Signed)
Anesthesia Evaluation  Patient identified by MRN, date of birth, ID band Patient awake    Reviewed: Allergy & Precautions, H&P , NPO status , Patient's Chart, lab work & pertinent test results, reviewed documented beta blocker date and time   Airway Mallampati: III  TM Distance: >3 FB Neck ROM: full    Dental  (+) Upper Dentures, Lower Dentures   Pulmonary sleep apnea (no cpap) , former smoker,    Pulmonary exam normal breath sounds clear to auscultation       Cardiovascular Exercise Tolerance: Good hypertension, Normal cardiovascular exam Rhythm:regular Rate:Normal     Neuro/Psych Anxiety negative neurological ROS     GI/Hepatic Neg liver ROS, GERD  Controlled,  Endo/Other  diabetesMorbid obesity (bmi 36)  Renal/GU negative Renal ROS  negative genitourinary   Musculoskeletal  (+) Arthritis ,   Abdominal   Peds  Hematology Ovarian ca : 2006   Anesthesia Other Findings   Reproductive/Obstetrics negative OB ROS                             Anesthesia Physical  Anesthesia Plan  ASA: 2  Anesthesia Plan: MAC   Post-op Pain Management:    Induction:   PONV Risk Score and Plan: 2 and TIVA and Midazolam  Airway Management Planned:   Additional Equipment:   Intra-op Plan:   Post-operative Plan:   Informed Consent: I have reviewed the patients History and Physical, chart, labs and discussed the procedure including the risks, benefits and alternatives for the proposed anesthesia with the patient or authorized representative who has indicated his/her understanding and acceptance.       Plan Discussed with: CRNA  Anesthesia Plan Comments:         Anesthesia Quick Evaluation

## 2021-03-18 NOTE — H&P (Signed)
[facility]   Primary Care Physician:  Kirk Ruths, MD Ophthalmologist: Dr. Leandrew Koyanagi  Pre-Procedure History & Physical: HPI:  Natasha Jimenez is a 65 y.o. female here for ophthalmic surgery.   Past Medical History:  Diagnosis Date   Anxiety    Arthritis    Diabetes mellitus without complication ([place])    Family history of adverse reaction to anesthesia    GERD (gastroesophageal reflux disease)    OCC-TUMS   Hypertension    Ovarian cancer ([place]) 2006   Sleep apnea    NO CPAP     Past Surgical History:  Procedure Laterality Date   ABDOMINAL HYSTERECTOMY  2006   CATARACT EXTRACTION W/PHACO Left 03/04/2021   Procedure: CATARACT EXTRACTION PHACO AND INTRAOCULAR LENS PLACEMENT ([place]) LEFT;  Surgeon: Leandrew Koyanagi, MD;  Location: [facility];  Service: Ophthalmology;  Laterality: Left;  2.87 00:37.5   LIPOMA EXCISION Right 08/26/2017   Procedure: EXCISION LIPOMA RIGHT FLANK;  Surgeon: Robert Bellow, MD;  Location: [facility] ORS;  Service: General;  Laterality: Right;   NOSE SURGERY      Prior to Admission medications   Medication Sig Start Date End Date Taking? Authorizing Provider  acetaminophen (TYLENOL) 500 MG tablet Take 1,000 mg by mouth daily as needed for moderate pain or headache.   Yes [provider]  Aromatic Inhalants (VICKS VAPOINHALER) INHA Inhale 1 Dose into the lungs at bedtime.   Yes [provider]  calcium carbonate (TUMS - DOSED IN MG ELEMENTAL CALCIUM) 500 MG chewable tablet Chew 1 tablet by mouth as needed for indigestion or heartburn.   Yes [provider]  estradiol (ESTRACE) 1 MG tablet Take 1 mg by mouth daily.  07/27/17  Yes [provider]  FLUoxetine (PROZAC) 20 MG capsule Take 20 mg by mouth every morning.  07/27/17  Yes [provider]  ibuprofen (ADVIL,MOTRIN) 200 MG tablet Take 400 mg by mouth daily as needed for headache or moderate pain.   Yes [provider]  lisinopril-hydrochlorothiazide (PRINZIDE,ZESTORETIC) 20-25 MG tablet Take 1 tablet by mouth daily.  07/27/17  Yes [provider]  metFORMIN (GLUCOPHAGE-XR) 500 MG 24 hr tablet Take 500 mg by mouth daily with breakfast.  07/27/17  Yes [provider]  OVER THE COUNTER MEDICATION Take 1 tablet by mouth daily. Thyroid support otc supplement    Yes [provider]  rosuvastatin (CRESTOR) 20 MG tablet Take 20 mg by mouth at bedtime.  07/27/17 03/05/21 Yes [provider]  empagliflozin (JARDIANCE) 10 MG TABS tablet Take 10 mg by mouth daily. Patient not taking: Reported on 03/18/2021    [provider]    Allergies as of 02/04/2021   (No Known Allergies)    Family History  Problem Relation Age of Onset   Stomach cancer Father    Colon cancer Neg Hx    Breast cancer Neg Hx     Social History   Socioeconomic History   Marital status: Married    Spouse name: Not on file   Number of children: Not on file   Years of education: Not on file   Highest education level: Not on file  Occupational History   Not on file  Tobacco Use   Smoking status: Former    Packs/day: 0.50    Years: 2.00    Pack years: 1.00    Types: Cigarettes    Quit date: 06/14/2014    Years since quitting: 6.7   Smokeless tobacco:  Never  Vaping Use   Vaping Use: Never used  Substance and Sexual Activity   Alcohol use: No   Drug use: No   Sexual activity: Not on file  Other Topics Concern   Not on file  Social History Narrative   Not on file   Social Determinants of Health   Financial Resource Strain: Not on file  Food Insecurity: Not on file  Transportation Needs: Not on file  Physical Activity: Not on file  Stress: Not on file  Social Connections: Not on file  Intimate Partner Violence: Not on file    Review of Systems: See HPI, otherwise negative ROS  Physical Exam: BP 137/89   Pulse 66   Temp (!) 97.3 F (36.3 C) (Temporal)   Resp 18   Ht 5\' 3"   (1.6 m)   Wt 93.4 kg   SpO2 96%   BMI 36.49 kg/m  General:   Alert,  pleasant and cooperative in NAD Head:  Normocephalic and atraumatic. Lungs:  Clear to auscultation.    Heart:  Regular rate and rhythm.   Impression/Plan: Natasha Jimenez is here for ophthalmic surgery.  Risks, benefits, limitations, and alternatives regarding ophthalmic surgery have been reviewed with the patient.  Questions have been answered.  All parties agreeable.   Leandrew Koyanagi, MD  03/18/2021, 9:50 AM

## 2021-03-18 NOTE — Transfer of Care (Signed)
Immediate Anesthesia Transfer of Care Note  Patient: Natasha Jimenez  Procedure(s) Performed: CATARACT EXTRACTION PHACO AND INTRAOCULAR LENS PLACEMENT (IOC) RIGHT 3.89 00:42.4 (Right: Eye)  Patient Location: PACU  Anesthesia Type: MAC  Level of Consciousness: awake, alert  and patient cooperative  Airway and Oxygen Therapy: Patient Spontanous Breathing and Patient connected to supplemental oxygen  Post-op Assessment: Post-op Vital signs reviewed, Patient's Cardiovascular Status Stable, Respiratory Function Stable, Patent Airway and No signs of Nausea or vomiting  Post-op Vital Signs: Reviewed and stable  Complications: No notable events documented.

## 2021-03-18 NOTE — Anesthesia Postprocedure Evaluation (Signed)
Anesthesia Post Note  Patient: Natasha Jimenez  Procedure(s) Performed: CATARACT EXTRACTION PHACO AND INTRAOCULAR LENS PLACEMENT (IOC) RIGHT 3.89 00:42.4 (Right: Eye)     Patient location during evaluation: PACU Anesthesia Type: MAC Level of consciousness: awake and alert Pain management: pain level controlled Vital Signs Assessment: post-procedure vital signs reviewed and stable Respiratory status: spontaneous breathing, nonlabored ventilation, respiratory function stable and patient connected to nasal cannula oxygen Cardiovascular status: stable and blood pressure returned to baseline Postop Assessment: no apparent nausea or vomiting Anesthetic complications: no   No notable events documented.  Fidel Levy

## 2021-03-18 NOTE — Op Note (Signed)
LOCATION:  [place]   PREOPERATIVE DIAGNOSIS:    Nuclear sclerotic cataract right eye. H25.11   POSTOPERATIVE DIAGNOSIS:  Nuclear sclerotic cataract right eye.     PROCEDURE:  Phacoemusification with posterior chamber intraocular lens placement of the right eye   ULTRASOUND TIME: Procedure(s): CATARACT EXTRACTION PHACO AND INTRAOCULAR LENS PLACEMENT (IOC) RIGHT 3.89 00:42.4 (Right)  LENS:   Implant Name Type Inv. Item Serial No. Manufacturer Lot No. LRB No. Used Action  LENS IOL TECNIS EYHANCE 23.0 - [id number] Intraocular Lens LENS IOL TECNIS EYHANCE 23.0 [id number] JOHNSON   Right 1 Implanted         SURGEON:  Wyonia Hough, MD   ANESTHESIA:  Topical with tetracaine drops and 2% Xylocaine jelly, augmented with 1% preservative-free intracameral lidocaine.    COMPLICATIONS:  None.   DESCRIPTION OF PROCEDURE:  The patient was identified in the holding room and transported to the operating room and placed in the supine position under the operating microscope.  The right eye was identified as the operative eye and it was prepped and draped in the usual sterile ophthalmic fashion.   A 1 millimeter clear-corneal paracentesis was made at the 12:00 position.  0.5 ml of preservative-free 1% lidocaine was injected into the anterior chamber. The anterior chamber was filled with Viscoat viscoelastic.  A 2.4 millimeter keratome was used to make a near-clear corneal incision at the 9:00 position.  A curvilinear capsulorrhexis was made with a cystotome and capsulorrhexis forceps.  Balanced salt solution was used to hydrodissect and hydrodelineate the nucleus.   Phacoemulsification was then used in stop and chop fashion to remove the lens nucleus and epinucleus.  The remaining cortex was then removed using the irrigation and aspiration handpiece. Provisc was then placed into the capsular bag to distend it for lens placement.  A lens was then injected into the capsular bag.  The  remaining viscoelastic was aspirated.   Wounds were hydrated with balanced salt solution.  The anterior chamber was inflated to a physiologic pressure with balanced salt solution.  No wound leaks were noted. Cefuroxime 0.1 ml of a 10mg /ml solution was injected into the anterior chamber for a dose of 1 mg of intracameral antibiotic at the completion of the case.   Timolol and Brimonidine drops were applied to the eye.  The patient was taken to the recovery room in stable condition without complications of anesthesia or surgery.   Aedon Deason 03/18/2021, 10:32 AM

## 2021-03-18 NOTE — Anesthesia Procedure Notes (Signed)
Procedure Name: MAC Date/Time: 03/18/2021 10:18 AM Performed by: Mayme Genta, CRNA Pre-anesthesia Checklist: Patient identified, Emergency Drugs available, Suction available, Timeout performed and Patient being monitored Patient Re-evaluated:Patient Re-evaluated prior to induction Oxygen Delivery Method: Nasal cannula Placement Confirmation: positive ETCO2

## 2021-03-19 ENCOUNTER — Encounter: Payer: Self-pay | Admitting: Ophthalmology

## 2022-08-27 DIAGNOSIS — G4733 Obstructive sleep apnea (adult) (pediatric): Secondary | ICD-10-CM | POA: Diagnosis not present

## 2022-08-27 DIAGNOSIS — E7800 Pure hypercholesterolemia, unspecified: Secondary | ICD-10-CM | POA: Diagnosis not present

## 2022-08-27 DIAGNOSIS — F325 Major depressive disorder, single episode, in full remission: Secondary | ICD-10-CM | POA: Diagnosis not present

## 2022-08-27 DIAGNOSIS — I10 Essential (primary) hypertension: Secondary | ICD-10-CM | POA: Diagnosis not present

## 2022-08-27 DIAGNOSIS — E119 Type 2 diabetes mellitus without complications: Secondary | ICD-10-CM | POA: Diagnosis not present

## 2022-09-06 DIAGNOSIS — Z0100 Encounter for examination of eyes and vision without abnormal findings: Secondary | ICD-10-CM | POA: Diagnosis not present

## 2022-09-06 DIAGNOSIS — E119 Type 2 diabetes mellitus without complications: Secondary | ICD-10-CM | POA: Diagnosis not present

## 2022-09-06 DIAGNOSIS — H43813 Vitreous degeneration, bilateral: Secondary | ICD-10-CM | POA: Diagnosis not present

## 2022-09-06 DIAGNOSIS — Z961 Presence of intraocular lens: Secondary | ICD-10-CM | POA: Diagnosis not present

## 2022-09-06 DIAGNOSIS — H33311 Horseshoe tear of retina without detachment, right eye: Secondary | ICD-10-CM | POA: Diagnosis not present

## 2022-10-11 ENCOUNTER — Other Ambulatory Visit: Payer: Self-pay

## 2022-10-11 ENCOUNTER — Emergency Department: Payer: Medicare HMO

## 2022-10-11 ENCOUNTER — Emergency Department
Admission: EM | Admit: 2022-10-11 | Discharge: 2022-10-11 | Disposition: A | Discharge status: Home / Self Care | Payer: Medicare HMO | Attending: Student in an Organized Health Care Education/Training Program | Admitting: Student in an Organized Health Care Education/Training Program

## 2022-10-11 DIAGNOSIS — M47812 Spondylosis without myelopathy or radiculopathy, cervical region: Secondary | ICD-10-CM | POA: Diagnosis not present

## 2022-10-11 DIAGNOSIS — R0902 Hypoxemia: Secondary | ICD-10-CM | POA: Diagnosis not present

## 2022-10-11 DIAGNOSIS — R55 Syncope and collapse: Secondary | ICD-10-CM

## 2022-10-11 DIAGNOSIS — E119 Type 2 diabetes mellitus without complications: Secondary | ICD-10-CM | POA: Diagnosis not present

## 2022-10-11 DIAGNOSIS — I959 Hypotension, unspecified: Secondary | ICD-10-CM | POA: Diagnosis not present

## 2022-10-11 DIAGNOSIS — K5730 Diverticulosis of large intestine without perforation or abscess without bleeding: Secondary | ICD-10-CM | POA: Diagnosis not present

## 2022-10-11 DIAGNOSIS — R911 Solitary pulmonary nodule: Secondary | ICD-10-CM | POA: Diagnosis not present

## 2022-10-11 DIAGNOSIS — E860 Dehydration: Secondary | ICD-10-CM | POA: Diagnosis not present

## 2022-10-11 DIAGNOSIS — K429 Umbilical hernia without obstruction or gangrene: Secondary | ICD-10-CM | POA: Diagnosis not present

## 2022-10-11 DIAGNOSIS — M542 Cervicalgia: Secondary | ICD-10-CM | POA: Diagnosis not present

## 2022-10-11 DIAGNOSIS — R079 Chest pain, unspecified: Secondary | ICD-10-CM | POA: Diagnosis not present

## 2022-10-11 DIAGNOSIS — I10 Essential (primary) hypertension: Secondary | ICD-10-CM | POA: Diagnosis not present

## 2022-10-11 DIAGNOSIS — R519 Headache, unspecified: Secondary | ICD-10-CM | POA: Diagnosis not present

## 2022-10-11 DIAGNOSIS — M545 Low back pain, unspecified: Secondary | ICD-10-CM | POA: Diagnosis not present

## 2022-10-11 LAB — COMPREHENSIVE METABOLIC PANEL
ALT: 12 U/L (ref 0–44)
AST: 20 U/L (ref 15–41)
Albumin: 3.7 g/dL (ref 3.5–5.0)
Alkaline Phosphatase: 48 U/L (ref 38–126)
Anion gap: 10 (ref 5–15)
BUN: 21 mg/dL (ref 8–23)
CO2: 25 mmol/L (ref 22–32)
Calcium: 8.7 mg/dL — ABNORMAL LOW (ref 8.9–10.3)
Chloride: 103 mmol/L (ref 98–111)
Creatinine, Ser: 1.24 mg/dL — ABNORMAL HIGH (ref 0.44–1.00)
GFR, Estimated: 47 mL/min — ABNORMAL LOW (ref >60)
Glucose, Bld: 113 mg/dL — ABNORMAL HIGH (ref 70–99)
Potassium: 3.1 mmol/L — ABNORMAL LOW (ref 3.5–5.1)
Sodium: 138 mmol/L (ref 135–145)
Total Bilirubin: 0.7 mg/dL (ref 0.3–1.2)
Total Protein: 6.8 g/dL (ref 6.5–8.1)

## 2022-10-11 LAB — CBC WITH DIFFERENTIAL/PLATELET
Abs Immature Granulocytes: 0.06 10*3/uL (ref 0.00–0.07)
Basophils Absolute: 0.1 10*3/uL (ref 0.0–0.1)
Basophils Relative: 1 %
Eosinophils Absolute: 0.1 10*3/uL (ref 0.0–0.5)
Eosinophils Relative: 1 %
HCT: 37.2 % (ref 36.0–46.0)
Hemoglobin: 12.1 g/dL (ref 12.0–15.0)
Immature Granulocytes: 1 %
Lymphocytes Relative: 13 %
Lymphs Abs: 1.3 10*3/uL (ref 0.7–4.0)
MCH: 27.2 pg (ref 26.0–34.0)
MCHC: 32.5 g/dL (ref 30.0–36.0)
MCV: 83.6 fL (ref 80.0–100.0)
Monocytes Absolute: 0.5 10*3/uL (ref 0.1–1.0)
Monocytes Relative: 5 %
Neutro Abs: 8.2 10*3/uL — ABNORMAL HIGH (ref 1.7–7.7)
Neutrophils Relative %: 79 %
Platelets: 262 10*3/uL (ref 150–400)
RBC: 4.45 MIL/uL (ref 3.87–5.11)
RDW: 12.4 % (ref 11.5–15.5)
WBC: 10.2 10*3/uL (ref 4.0–10.5)
nRBC: 0 % (ref 0.0–0.2)

## 2022-10-11 LAB — URINALYSIS, ROUTINE W REFLEX MICROSCOPIC
Bilirubin Urine: NEGATIVE
Glucose, UA: NEGATIVE mg/dL
Hgb urine dipstick: NEGATIVE
Ketones, ur: NEGATIVE mg/dL
Leukocytes,Ua: NEGATIVE
Nitrite: NEGATIVE
Protein, ur: NEGATIVE mg/dL
Specific Gravity, Urine: 1.012 (ref 1.005–1.030)
pH: 5 (ref 5.0–8.0)

## 2022-10-11 LAB — TROPONIN I (HIGH SENSITIVITY)
Troponin I (High Sensitivity): 5 ng/L (ref <18)
Troponin I (High Sensitivity): 3 ng/L (ref <18)

## 2022-10-11 MED ORDER — IOHEXOL 350 MG/ML SOLN
100.0000 mL | Freq: Once | INTRAVENOUS | Status: AC | PRN
  Administered 2022-10-11: 100 mL via INTRAVENOUS

## 2022-10-11 MED ORDER — SODIUM CHLORIDE 0.9 % IV BOLUS
1000.0000 mL | Freq: Once | INTRAVENOUS | Status: AC
  Administered 2022-10-11: 1000 mL via INTRAVENOUS

## 2022-10-11 NOTE — ED Triage Notes (Signed)
Pt arrived via EMS from home with c/o syncope. Son caught pt on the way down, and was helped to the ground. Per son, pt did not hit head. Before the syncopal episode, pt reports sharp pain in lower back, on both sides of spine "probably where kidneys are." Pt had been working in and around the house today and reports only drinking coffee this morning.

## 2022-10-11 NOTE — ED Provider Notes (Signed)
[facility] Provider Note    Event Date/Time   First MD Initiated Contact with Patient 10/11/22 1609     (approximate)   History   Loss of Consciousness and Back Pain   HPI  Natasha Jimenez is a 67 y.o. female  with history of diabetes, hypertension, and as listed in EMR presents to the emergency department for evaluation after syncopal event. Prior to syncope she had 2 brief episodes of chest pain that resolved with rest. She then started to go toward the couch but passed out. Her son caught her and helped her onto the couch. Currently not having any chest pain or shortness of breath. She does have some pain on the left side of her neck that she relates to the syncopal event. She also states that she hasn't eaten today.      Physical Exam   Triage Vital Signs: ED Triage Vitals  Enc Vitals Group     BP 10/11/22 1609 110/66     Pulse Rate 10/11/22 1609 70     Resp 10/11/22 1609 16     Temp 10/11/22 1609 97.9 F (36.6 C)     Temp Source 10/11/22 1609 Oral     SpO2 10/11/22 1609 100 %     Weight 10/11/22 1610 170 lb (77.1 kg)     Height 10/11/22 1610 5\' 3"  (1.6 m)     Head Circumference --      Peak Flow --      Pain Score 10/11/22 1615 0     Pain Loc --      Pain Edu? --      Excl. in GC? --     Most recent vital signs: Vitals:   10/11/22 1730 10/11/22 1800  BP: 115/68 110/67  Pulse: 69 64  Resp: 18 13  Temp:    SpO2: 100% 100%    General: Awake, no distress.  CV:  Good peripheral perfusion. S1 &S2   Resp:  Normal effort. Breath sounds clear Abd:  No distention.  Other:     ED Results / Procedures / Treatments   Labs (all labs ordered are listed, but only abnormal results are displayed) Labs Reviewed  COMPREHENSIVE METABOLIC PANEL - Abnormal; Notable for the following components:      Result Value   Potassium 3.1 (*)    Glucose, Bld 113 (*)    Creatinine, Ser 1.24 (*)    Calcium 8.7 (*)    GFR, Estimated 47 (*)    All  other components within normal limits  CBC WITH DIFFERENTIAL/PLATELET - Abnormal; Notable for the following components:   Neutro Abs 8.2 (*)    All other components within normal limits  URINALYSIS, ROUTINE W REFLEX MICROSCOPIC - Abnormal; Notable for the following components:   Color, Urine YELLOW (*)    APPearance HAZY (*)    All other components within normal limits  TROPONIN I (HIGH SENSITIVITY)  TROPONIN I (HIGH SENSITIVITY)     EKG  Sinus rhythm, rate of 68   RADIOLOGY  Image and radiology report reviewed and interpreted by me. Radiology report consistent with the same.  CT head and cervical spine without acute concerns.  Chest x-ray negative for cardiopulmonary abnormality.   PROCEDURES:  Critical Care performed: No  Procedures: bedside cardiac monitoring    MEDICATIONS ORDERED IN ED:  Medications - No data to display   IMPRESSION / MDM / ASSESSMENT AND PLAN / ED COURSE   I have reviewed the  triage note.  Differential diagnosis includes, but is not limited to, cardiac event, CVA, TIA, syncope  Patient's presentation is most consistent with acute presentation with potential threat to life or bodily function.  67 year old female presenting to the ER after syncopal event. See HPI.  EKG shows sinus rhythm and initial troponin is normal at 5. CBC and CMP without acute concerns.   Care transitioned to Dr. Roxan Hockey who will follow up on outstanding imaging and labs to determine disposition.      FINAL CLINICAL IMPRESSION(S) / ED DIAGNOSES   Final diagnoses:  Syncope, unspecified syncope type     Rx / DC Orders   ED Discharge Orders     None        Note:  This document was prepared using Dragon voice recognition software and may include unintentional dictation errors.   Chinita Pester, FNP 10/11/22 1914    Willy Eddy, MD 10/11/22 Bernell List    Willy Eddy, MD 10/11/22 2055

## 2022-10-11 NOTE — ED Notes (Signed)
Patient transported to CT 

## 2022-10-11 NOTE — ED Notes (Signed)
Patient transported to back from CT 

## 2022-10-18 DIAGNOSIS — R55 Syncope and collapse: Secondary | ICD-10-CM | POA: Diagnosis not present

## 2022-10-19 ENCOUNTER — Telehealth: Payer: Self-pay

## 2022-10-19 NOTE — Telephone Encounter (Signed)
Transition Care Management Follow-up Telephone Call Date of discharge and from where: 10/11/2022 [facility] How have you been since you were released from the hospital? Patient is feeling better. Any questions or concerns? No  Items Reviewed: Did the pt receive and understand the discharge instructions provided? Yes  Medications obtained and verified? Yes  Other? No  Any new allergies since your discharge? No  Dietary orders reviewed? Yes Do you have support at home?  Patient lives independently  Follow up appointments reviewed:  PCP Hospital f/u appt confirmed?  Patient followed up with PCP by phone.  Scheduled to see  on  @ . Specialist Hospital f/u appt confirmed? No  Scheduled to see  on  @ . Are transportation arrangements needed? No  If their condition worsens, is the pt aware to call PCP or go to the Emergency Dept.? Yes Was the patient provided with contact information for the PCP's office or ED? Yes Was to pt encouraged to call back with questions or concerns? Yes  Florence Yeung [facility]  [facility] Population Health Community Resource Care Guide   [email or web address]  ?? [phone]   Website: [email or web address]

## 2022-10-20 ENCOUNTER — Telehealth: Payer: Self-pay

## 2022-10-20 NOTE — Telephone Encounter (Signed)
Entered in error

## 2022-11-04 DIAGNOSIS — R55 Syncope and collapse: Secondary | ICD-10-CM | POA: Diagnosis not present

## 2022-11-04 DIAGNOSIS — R21 Rash and other nonspecific skin eruption: Secondary | ICD-10-CM | POA: Diagnosis not present

## 2022-12-31 DIAGNOSIS — R519 Headache, unspecified: Secondary | ICD-10-CM | POA: Diagnosis not present

## 2022-12-31 DIAGNOSIS — Z20822 Contact with and (suspected) exposure to covid-19: Secondary | ICD-10-CM | POA: Diagnosis not present

## 2022-12-31 DIAGNOSIS — R0981 Nasal congestion: Secondary | ICD-10-CM | POA: Diagnosis not present

## 2022-12-31 DIAGNOSIS — J029 Acute pharyngitis, unspecified: Secondary | ICD-10-CM | POA: Diagnosis not present

## 2023-01-03 DIAGNOSIS — Z20822 Contact with and (suspected) exposure to covid-19: Secondary | ICD-10-CM | POA: Diagnosis not present

## 2023-01-03 DIAGNOSIS — J309 Allergic rhinitis, unspecified: Secondary | ICD-10-CM | POA: Diagnosis not present

## 2023-01-03 DIAGNOSIS — J029 Acute pharyngitis, unspecified: Secondary | ICD-10-CM | POA: Diagnosis not present

## 2023-01-03 DIAGNOSIS — R519 Headache, unspecified: Secondary | ICD-10-CM | POA: Diagnosis not present

## 2023-01-03 DIAGNOSIS — R0981 Nasal congestion: Secondary | ICD-10-CM | POA: Diagnosis not present

## 2023-01-07 DIAGNOSIS — R519 Headache, unspecified: Secondary | ICD-10-CM | POA: Diagnosis not present

## 2023-01-07 DIAGNOSIS — J029 Acute pharyngitis, unspecified: Secondary | ICD-10-CM | POA: Diagnosis not present

## 2023-01-07 DIAGNOSIS — Z20822 Contact with and (suspected) exposure to covid-19: Secondary | ICD-10-CM | POA: Diagnosis not present

## 2023-01-07 DIAGNOSIS — R0981 Nasal congestion: Secondary | ICD-10-CM | POA: Diagnosis not present

## 2023-01-07 DIAGNOSIS — J309 Allergic rhinitis, unspecified: Secondary | ICD-10-CM | POA: Diagnosis not present

## 2023-03-01 DIAGNOSIS — Z0000 Encounter for general adult medical examination without abnormal findings: Secondary | ICD-10-CM | POA: Diagnosis not present

## 2023-03-01 DIAGNOSIS — E7800 Pure hypercholesterolemia, unspecified: Secondary | ICD-10-CM | POA: Diagnosis not present

## 2023-03-01 DIAGNOSIS — M25569 Pain in unspecified knee: Secondary | ICD-10-CM | POA: Diagnosis not present

## 2023-03-01 DIAGNOSIS — I10 Essential (primary) hypertension: Secondary | ICD-10-CM | POA: Diagnosis not present

## 2023-03-01 DIAGNOSIS — Z2821 Immunization not carried out because of patient refusal: Secondary | ICD-10-CM | POA: Diagnosis not present

## 2023-03-01 DIAGNOSIS — G8929 Other chronic pain: Secondary | ICD-10-CM | POA: Diagnosis not present

## 2023-03-01 DIAGNOSIS — E119 Type 2 diabetes mellitus without complications: Secondary | ICD-10-CM | POA: Diagnosis not present

## 2023-03-01 DIAGNOSIS — F325 Major depressive disorder, single episode, in full remission: Secondary | ICD-10-CM | POA: Diagnosis not present

## 2023-03-01 DIAGNOSIS — Z1331 Encounter for screening for depression: Secondary | ICD-10-CM | POA: Diagnosis not present

## 2023-03-10 DIAGNOSIS — M170 Bilateral primary osteoarthritis of knee: Secondary | ICD-10-CM | POA: Diagnosis not present

## 2023-07-01 DIAGNOSIS — E119 Type 2 diabetes mellitus without complications: Secondary | ICD-10-CM | POA: Diagnosis not present

## 2023-07-01 DIAGNOSIS — I10 Essential (primary) hypertension: Secondary | ICD-10-CM | POA: Diagnosis not present

## 2023-07-01 DIAGNOSIS — F325 Major depressive disorder, single episode, in full remission: Secondary | ICD-10-CM | POA: Diagnosis not present

## 2023-07-01 DIAGNOSIS — E7800 Pure hypercholesterolemia, unspecified: Secondary | ICD-10-CM | POA: Diagnosis not present

## 2023-07-01 DIAGNOSIS — G4733 Obstructive sleep apnea (adult) (pediatric): Secondary | ICD-10-CM | POA: Diagnosis not present

## 2023-10-31 DIAGNOSIS — F325 Major depressive disorder, single episode, in full remission: Secondary | ICD-10-CM | POA: Diagnosis not present

## 2023-10-31 DIAGNOSIS — E7800 Pure hypercholesterolemia, unspecified: Secondary | ICD-10-CM | POA: Diagnosis not present

## 2023-10-31 DIAGNOSIS — I10 Essential (primary) hypertension: Secondary | ICD-10-CM | POA: Diagnosis not present

## 2023-10-31 DIAGNOSIS — E119 Type 2 diabetes mellitus without complications: Secondary | ICD-10-CM | POA: Diagnosis not present

## 2024-02-22 DIAGNOSIS — E119 Type 2 diabetes mellitus without complications: Secondary | ICD-10-CM | POA: Diagnosis not present

## 2024-02-22 DIAGNOSIS — M5442 Lumbago with sciatica, left side: Secondary | ICD-10-CM | POA: Diagnosis not present
